# Patient Record
Sex: Female | Born: 1935
Health system: Southern US, Community
[De-identification: ages and names within clinical notes are randomized; demographics above are authoritative.]

## PROBLEM LIST (undated history)

## (undated) DIAGNOSIS — H409 Unspecified glaucoma: Secondary | ICD-10-CM

## (undated) DIAGNOSIS — K449 Diaphragmatic hernia without obstruction or gangrene: Secondary | ICD-10-CM

## (undated) DIAGNOSIS — E042 Nontoxic multinodular goiter: Secondary | ICD-10-CM

## (undated) DIAGNOSIS — M810 Age-related osteoporosis without current pathological fracture: Secondary | ICD-10-CM

## (undated) DIAGNOSIS — E7800 Pure hypercholesterolemia, unspecified: Secondary | ICD-10-CM

## (undated) DIAGNOSIS — Z87891 Personal history of nicotine dependence: Secondary | ICD-10-CM

## (undated) DIAGNOSIS — R7301 Impaired fasting glucose: Secondary | ICD-10-CM

## (undated) HISTORY — PX: CATARACT EXTRACTION, BILATERAL: SHX1313

## (undated) HISTORY — PX: APPENDECTOMY: SHX54

## (undated) HISTORY — DX: Impaired fasting glucose: R73.01

## (undated) HISTORY — DX: Unspecified glaucoma: H40.9

## (undated) HISTORY — DX: Diaphragmatic hernia without obstruction or gangrene: K44.9

## (undated) HISTORY — PX: TONSILLECTOMY: SUR1361

## (undated) HISTORY — DX: Personal history of nicotine dependence: Z87.891

## (undated) HISTORY — DX: Age-related osteoporosis without current pathological fracture: M81.0

## (undated) HISTORY — DX: Pure hypercholesterolemia, unspecified: E78.00

## (undated) HISTORY — DX: Nontoxic multinodular goiter: E04.2

---

## 2000-07-08 DIAGNOSIS — D126 Benign neoplasm of colon, unspecified: Secondary | ICD-10-CM

## 2000-07-08 HISTORY — DX: Benign neoplasm of colon, unspecified: D12.6

## 2000-08-01 ENCOUNTER — Ambulatory Visit (HOSPITAL_COMMUNITY): Admission: RE | Admit: 2000-08-01 | Discharge: 2000-08-01 | Payer: Self-pay | Admitting: *Deleted

## 2000-08-01 ENCOUNTER — Encounter: Payer: Self-pay | Admitting: Internal Medicine

## 2000-08-01 ENCOUNTER — Encounter (INDEPENDENT_AMBULATORY_CARE_PROVIDER_SITE_OTHER): Payer: Self-pay

## 2000-08-01 ENCOUNTER — Encounter (INDEPENDENT_AMBULATORY_CARE_PROVIDER_SITE_OTHER): Payer: Self-pay | Admitting: *Deleted

## 2002-05-10 DIAGNOSIS — D1803 Hemangioma of intra-abdominal structures: Secondary | ICD-10-CM

## 2002-05-10 HISTORY — DX: Hemangioma of intra-abdominal structures: D18.03

## 2003-01-21 ENCOUNTER — Encounter: Payer: Self-pay | Admitting: Internal Medicine

## 2003-01-21 ENCOUNTER — Encounter: Admission: RE | Admit: 2003-01-21 | Discharge: 2003-01-21 | Payer: Self-pay | Admitting: Internal Medicine

## 2003-02-06 ENCOUNTER — Encounter: Payer: Self-pay | Admitting: Internal Medicine

## 2003-02-06 ENCOUNTER — Encounter: Admission: RE | Admit: 2003-02-06 | Discharge: 2003-02-06 | Payer: Self-pay | Admitting: Internal Medicine

## 2003-02-12 ENCOUNTER — Encounter: Admission: RE | Admit: 2003-02-12 | Discharge: 2003-02-12 | Payer: Self-pay | Admitting: Internal Medicine

## 2003-02-12 ENCOUNTER — Encounter: Payer: Self-pay | Admitting: Internal Medicine

## 2004-07-14 ENCOUNTER — Ambulatory Visit: Payer: Self-pay | Admitting: Internal Medicine

## 2004-07-28 ENCOUNTER — Ambulatory Visit: Payer: Self-pay | Admitting: Internal Medicine

## 2005-07-15 ENCOUNTER — Encounter: Admission: RE | Admit: 2005-07-15 | Discharge: 2005-07-15 | Payer: Self-pay | Admitting: Internal Medicine

## 2008-12-29 ENCOUNTER — Emergency Department (HOSPITAL_COMMUNITY): Admission: EM | Admit: 2008-12-29 | Discharge: 2008-12-29 | Payer: Self-pay | Admitting: Emergency Medicine

## 2009-07-02 ENCOUNTER — Encounter (INDEPENDENT_AMBULATORY_CARE_PROVIDER_SITE_OTHER): Payer: Self-pay | Admitting: *Deleted

## 2009-11-20 ENCOUNTER — Encounter: Admission: RE | Admit: 2009-11-20 | Discharge: 2009-11-20 | Payer: Self-pay | Admitting: Internal Medicine

## 2009-12-03 ENCOUNTER — Encounter: Payer: Self-pay | Admitting: Internal Medicine

## 2009-12-05 ENCOUNTER — Encounter: Payer: Self-pay | Admitting: Internal Medicine

## 2009-12-16 ENCOUNTER — Encounter: Payer: Self-pay | Admitting: Internal Medicine

## 2009-12-17 ENCOUNTER — Encounter: Payer: Self-pay | Admitting: Internal Medicine

## 2010-06-09 NOTE — Op Note (Signed)
Summary: COLON                          [facility]  Patient:    Tabitha Ramos, Tabitha Ramos                        MRN: [id number] Proc. Date: 08/01/00 Adm. Date:  [id number] Attending:  Sabino Gasser                           Procedure Report  PROCEDURE:  Colonoscopy with biopsy and polypectomy.  GASTROENTEROLOGIST:  Sabino Gasser, M.D.  ANESTHESIA:  Demerol 60 mg, Versed 6 mg.  PROCEDURE IN DETAIL:  With the patient mildly sedated and in the left lateral decubitus position, the Olympus video colonoscope was inserted in the rectum and then passed under direct vision to the cecum identified by ileocecal valve and appendiceal orifice.  From this point, the colonoscope was slowly withdrawn, taking circumferential views of the entire colonic mucosa, stopping at 20 cm from the anal verge at which point two polyps were seen and photographed.  One was removed using hot biopsy forceps technique, the other using snare cautery technique, both at setting of 20/20 blended current.  They were retrieved for pathology.   The colonoscope was withdrawn to the rectum which appeared normal on direct view and showed internal hemorrhoid in retroflexed view.  The endoscope was straightened and withdrawn.  The patients vital signs and pulse oximetry remained stable.  The patient tolerated the procedure well with no apparent complications.  FINDINGS: 1. Polyps at 20 cm from the anal verge.  Await biopsy report. 2. Internal hemorrhoids.  PLAN:  The patient will call me for results and follow up with me as an outpatient. DD:  08/01/00 TD:  08/01/00 Job: [id number]

## 2010-06-09 NOTE — Procedures (Signed)
Summary: COLON   Colonoscopy  Procedure date:  07/28/2004  Findings:      Location:  [facility].    Procedures Next Due Date:    Colonoscopy: 08/2009 Patient Name: Tabitha Ramos, Tabitha Ramos MRN:  Procedure Procedures: Colonoscopy CPT: [address].    with biopsy. CPT: Q5068410.  Personnel: Endoscopist: Dora L. Juanda Chance, MD.  Referred By: Erskine Speed, MD.  Exam Location: Exam performed in Outpatient Clinic. Outpatient  Patient Consent: Procedure, Alternatives, Risks and Benefits discussed, consent obtained, from patient. Consent was obtained by the RN.  Indications  Surveillance of: Adenomatous Polyp(s). Initial polypectomy was performed in 2002. 1-2 Polyps were found at Index Exam. Largest polyp removed was 1 to 5 mm. Prior polyp located in distal colon.  History  Current Medications: Patient is not currently taking Coumadin.  Pre-Exam Physical: Performed Jul 28, 2004. Entire physical exam was normal.  Exam Exam: Extent of exam reached: Cecum, extent intended: Cecum.  The cecum was identified by appendiceal orifice and IC valve. Colon retroflexion performed. Images taken. ASA Classification: I. Tolerance: good.  Monitoring: Pulse and BP monitoring, Oximetry used. Supplemental O2 given.  Colon Prep Used Miralax for colon prep. Prep results: good.  Sedation Meds: Patient assessed and found to be appropriate for moderate (conscious) sedation. Fentanyl 150 mcg. given IV. Versed 12 given IV.  Findings - NORMAL EXAM: Cecum.  POLYP: Sigmoid Colon, Maximum size: 3 mm. diminutive, sessile polyp. Distance from Anus 10 cm. Procedure:  biopsy without cautery, removed, retrieved, Polyp sent to pathology. ICD9: Colon Polyps: 211. 3.   Assessment Abnormal examination, see findings above.  Diagnoses: 211.3: Colon Polyps.   Comments: diminutive polyp removed Events  Unplanned Interventions: No intervention was required.  Unplanned Events: There were no  complications. Plans Medication Plan: Await pathology.  Patient Education: Patient given standard instructions for: Yearly hemoccult testing recommended. Patient instructed to get routine colonoscopy every 5 years.  Comments: difficult exam, pt had a lot of discomfort, requiring large amount of medication, there was lot of spasm in the sigmoid colon Disposition: After procedure patient sent to recovery. After recovery patient sent home.   This report was created from the original endoscopy report, which was reviewed and signed by the above listed endoscopist.    Appended Document: COLON letter dictated to Dr Renae Fickle. Green concerning change of the recall to 5 years. Message left on pt's phone to schedule recall colon.  Appended Document: COLON Recall entered in [facility] for now. I will make sure the patient has scheduled colonoscopy within the next several weeks.  Appended Document: COLON I spoke with patient late last month and she stated she had a death in the family and would not be able to schedule her colonoscopy yet. I advised Dr Juanda Chance of this.

## 2010-06-09 NOTE — Letter (Signed)
Summary: Enrique Sack MD  Enrique Sack MD   Imported By: Lester [place] 01/16/2010 11:43:18  _____________________________________________________________________  External Attachment:    Type:   Image     Comment:   External Document

## 2010-06-09 NOTE — Letter (Signed)
Summary: Letter to Dr Chilton Si  December 17, 2009         Erskine Speed, M.D.   [address], [address]   [place], [place] [address]      RE:  Tabitha Ramos, Tabitha Ramos   MRN:  [id number]  /  DOB:  05/23/1930      Dear Renae Fickle,      Thank you so much for your letter concerning Tabitha Ramos's recall   colonoscopy.  I went back and reviewed her records.  She did have a   tubulovillous adenoma on colonoscopy in 2002, and subsequently on   colonoscopy in March 2006, she had a hyperplastic polyp.  I have   discussed the recall guidelines for this type of situation with my   associates and we agreed that 5-year recall would be reasonable rather   than a 7-year recall as I suggested.  I have notified Tabitha Ramos, left   her message on her phone that we discussed it and that I would suggest   for her to have a colonoscopy this year.      Thank you very much for bringing this issue to my attention.  I am   always happy to review the case at your request.         Sincerely,               Hedwig Morton. Juanda Chance, MD         DMB/MedQ  DD: 12/17/2009  DT: 12/17/2009  Job #: [id number]

## 2010-06-09 NOTE — Letter (Signed)
Summary: Colonoscopy Date Change Letter  [facility]  [address] [place], [place] [address]   Phone: [phone]  Fax: [phone]      July 02, 2009 MRN: [id number]   Tabitha Ramos [address] [place], [place]  [address]   Dear [name],   Previously you were recommended to have a repeat colonoscopy around this time. Your chart was recently reviewed by Dr. Hedwig Morton. Juanda Chance of [facility]. Follow up colonoscopy is now recommended in March 2013. This revised recommendation is based on current, nationally recognized guidelines for colorectal cancer screening and polyp surveillance. These guidelines are endorsed by the American Cancer Society, [employer] on Colorectal Cancer as well as numerous other major medical organizations.  Please understand that our recommendation assumes that you do not have any new symptoms such as bleeding, a change in bowel habits, anemia, or significant abdominal discomfort. If you do have any concerning GI symptoms or want to discuss the guideline recommendations, please call to arrange an office visit at your earliest convenience. Otherwise we will keep you in our reminder system and contact you 1-2 months prior to the date listed above to schedule your next colonoscopy.  Thank you,  Hedwig Morton. Juanda Chance, M.D  [facility] [phone]

## 2010-08-15 LAB — DIFFERENTIAL
Basophils Absolute: 0.1 10*3/uL (ref 0.0–0.1)
Basophils Relative: 1 % (ref 0–1)
Eosinophils Absolute: 0.1 10*3/uL (ref 0.0–0.7)
Eosinophils Relative: 1 % (ref 0–5)
Lymphocytes Relative: 19 % (ref 12–46)

## 2010-08-15 LAB — CBC
HCT: 42.9 % (ref 36.0–46.0)
Platelets: 180 10*3/uL (ref 150–400)
RDW: 12.9 % (ref 11.5–15.5)

## 2010-08-15 LAB — BASIC METABOLIC PANEL
BUN: 8 mg/dL (ref 6–23)
GFR calc non Af Amer: >60 mL/min (ref >60)
Glucose, Bld: 93 mg/dL (ref 70–99)
Potassium: 4.3 mEq/L (ref 3.5–5.1)

## 2010-08-15 LAB — D-DIMER, QUANTITATIVE: D-Dimer, Quant: 0.32 ug/mL-FEU (ref 0.00–0.48)

## 2010-09-25 NOTE — Letter (Signed)
December 17, 2009    Erskine Speed, M.D.  [address], [address]  [place], [place] [address]   RE:  LARUE, DRAWDY  MRN:  [id number]  /  DOB:  [birth year]   Dear Renae Fickle,   Thank you so much for your letter concerning Ms. Mollenhauer's recall  colonoscopy.  I went back and reviewed her records.  She did have a  tubulovillous adenoma on colonoscopy in 2002, and subsequently on  colonoscopy in March 2006, she had a hyperplastic polyp.  I have  discussed the recall guidelines for this type of situation with my  associates and we agreed that 5-year recall would be reasonable rather  than a 7-year recall as I suggested.  I have notified Ms. Stenseth, left  her message on her phone that we discussed it and that I would suggest  for her to have a colonoscopy this year.   Thank you very much for bringing this issue to my attention.  I am  always happy to review the case at your request.    Sincerely,      Hedwig Morton. Juanda Chance, MD    DMB/MedQ  DD: 12/17/2009  DT: 12/17/2009  Job #: [id number]

## 2010-09-25 NOTE — Procedures (Signed)
[facility]  Patient:    Tabitha Ramos, Tabitha Ramos                        MRN: [id number] Proc. Date: 08/01/00 Adm. Date:  [id number] Attending:  Sabino Gasser                           Procedure Report  PROCEDURE:  Colonoscopy with biopsy and polypectomy.  GASTROENTEROLOGIST:  Sabino Gasser, M.D.  ANESTHESIA:  Demerol 60 mg, Versed 6 mg.  PROCEDURE IN DETAIL:  With the patient mildly sedated and in the left lateral decubitus position, the Olympus video colonoscope was inserted in the rectum and then passed under direct vision to the cecum identified by ileocecal valve and appendiceal orifice.  From this point, the colonoscope was slowly withdrawn, taking circumferential views of the entire colonic mucosa, stopping at 20 cm from the anal verge at which point two polyps were seen and photographed.  One was removed using hot biopsy forceps technique, the other using snare cautery technique, both at setting of 20/20 blended current.  They were retrieved for pathology.   The colonoscope was withdrawn to the rectum which appeared normal on direct view and showed internal hemorrhoid in retroflexed view.  The endoscope was straightened and withdrawn.  The patients vital signs and pulse oximetry remained stable.  The patient tolerated the procedure well with no apparent complications.  FINDINGS: 1. Polyps at 20 cm from the anal verge.  Await biopsy report. 2. Internal hemorrhoids.  PLAN:  The patient will call me for results and follow up with me as an outpatient. DD:  08/01/00 TD:  08/01/00 Job: [id number]

## 2012-02-21 ENCOUNTER — Other Ambulatory Visit: Payer: Self-pay | Admitting: Internal Medicine

## 2012-02-21 DIAGNOSIS — Z780 Asymptomatic menopausal state: Secondary | ICD-10-CM

## 2012-02-21 DIAGNOSIS — Z1231 Encounter for screening mammogram for malignant neoplasm of breast: Secondary | ICD-10-CM

## 2012-03-03 ENCOUNTER — Other Ambulatory Visit: Payer: Self-pay | Admitting: Internal Medicine

## 2012-03-03 ENCOUNTER — Ambulatory Visit
Admission: RE | Admit: 2012-03-03 | Discharge: 2012-03-03 | Disposition: A | Discharge status: Home / Self Care | Payer: Medicare Other | Source: Ambulatory Visit | Attending: Internal Medicine | Admitting: Internal Medicine

## 2012-03-03 DIAGNOSIS — M25569 Pain in unspecified knee: Secondary | ICD-10-CM

## 2012-03-20 ENCOUNTER — Other Ambulatory Visit: Payer: Self-pay

## 2012-03-20 ENCOUNTER — Ambulatory Visit: Payer: Self-pay

## 2012-04-07 ENCOUNTER — Ambulatory Visit
Admission: RE | Admit: 2012-04-07 | Discharge: 2012-04-07 | Disposition: A | Discharge status: Home / Self Care | Payer: Medicare Other | Source: Ambulatory Visit | Attending: Internal Medicine | Admitting: Internal Medicine

## 2012-04-07 DIAGNOSIS — Z780 Asymptomatic menopausal state: Secondary | ICD-10-CM

## 2012-04-07 DIAGNOSIS — Z1231 Encounter for screening mammogram for malignant neoplasm of breast: Secondary | ICD-10-CM

## 2013-03-26 ENCOUNTER — Ambulatory Visit
Admission: RE | Admit: 2013-03-26 | Discharge: 2013-03-26 | Disposition: A | Discharge status: Home / Self Care | Payer: Medicare Other | Source: Ambulatory Visit | Attending: Internal Medicine | Admitting: Internal Medicine

## 2013-03-26 ENCOUNTER — Other Ambulatory Visit: Payer: Self-pay | Admitting: Internal Medicine

## 2013-03-26 DIAGNOSIS — R05 Cough: Secondary | ICD-10-CM

## 2013-03-26 DIAGNOSIS — R509 Fever, unspecified: Secondary | ICD-10-CM

## 2014-09-12 IMAGING — CR DG TIBIA/FIBULA 2V*R*
2 series · 2 of 2 positions shown · non-contrast
Comparison: None

CLINICAL DATA: Lower leg pain, no injury

RIGHT TIBIA AND FIBULA - 2 VIEW

[view not recorded (1 of 2)]
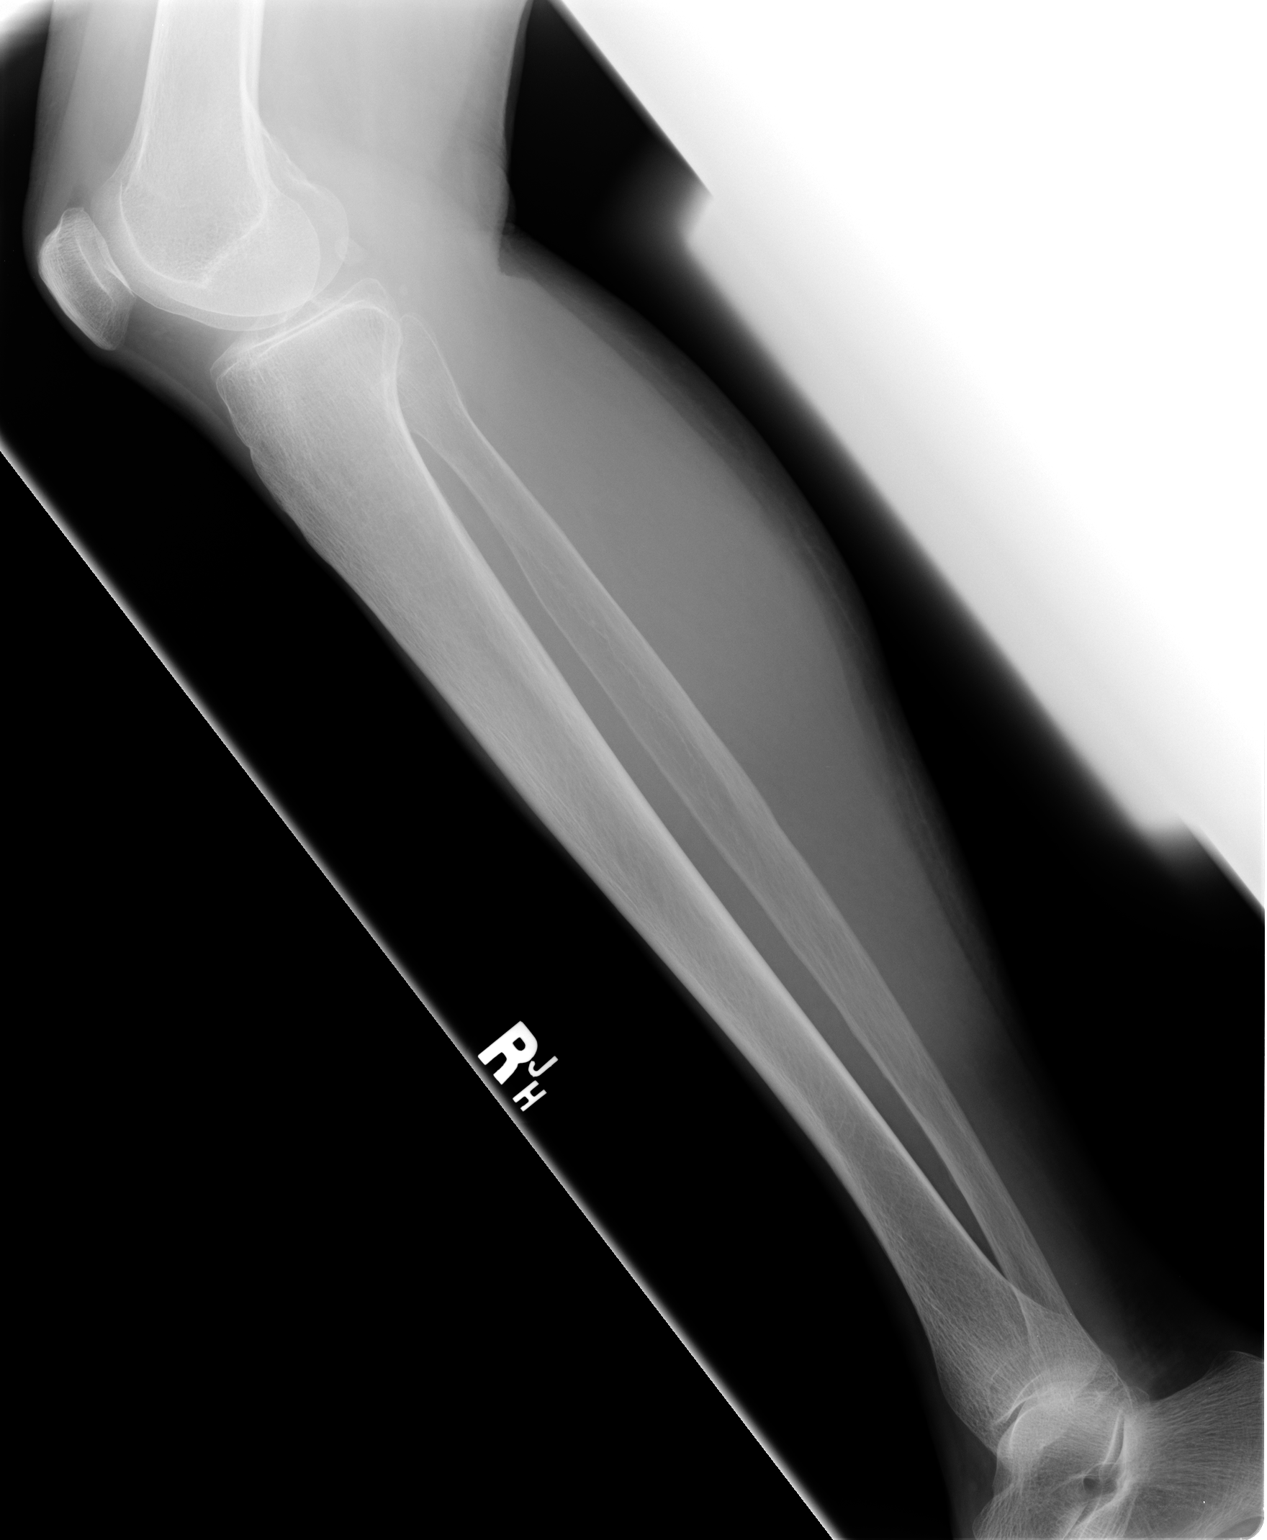

[view not recorded (2 of 2)]
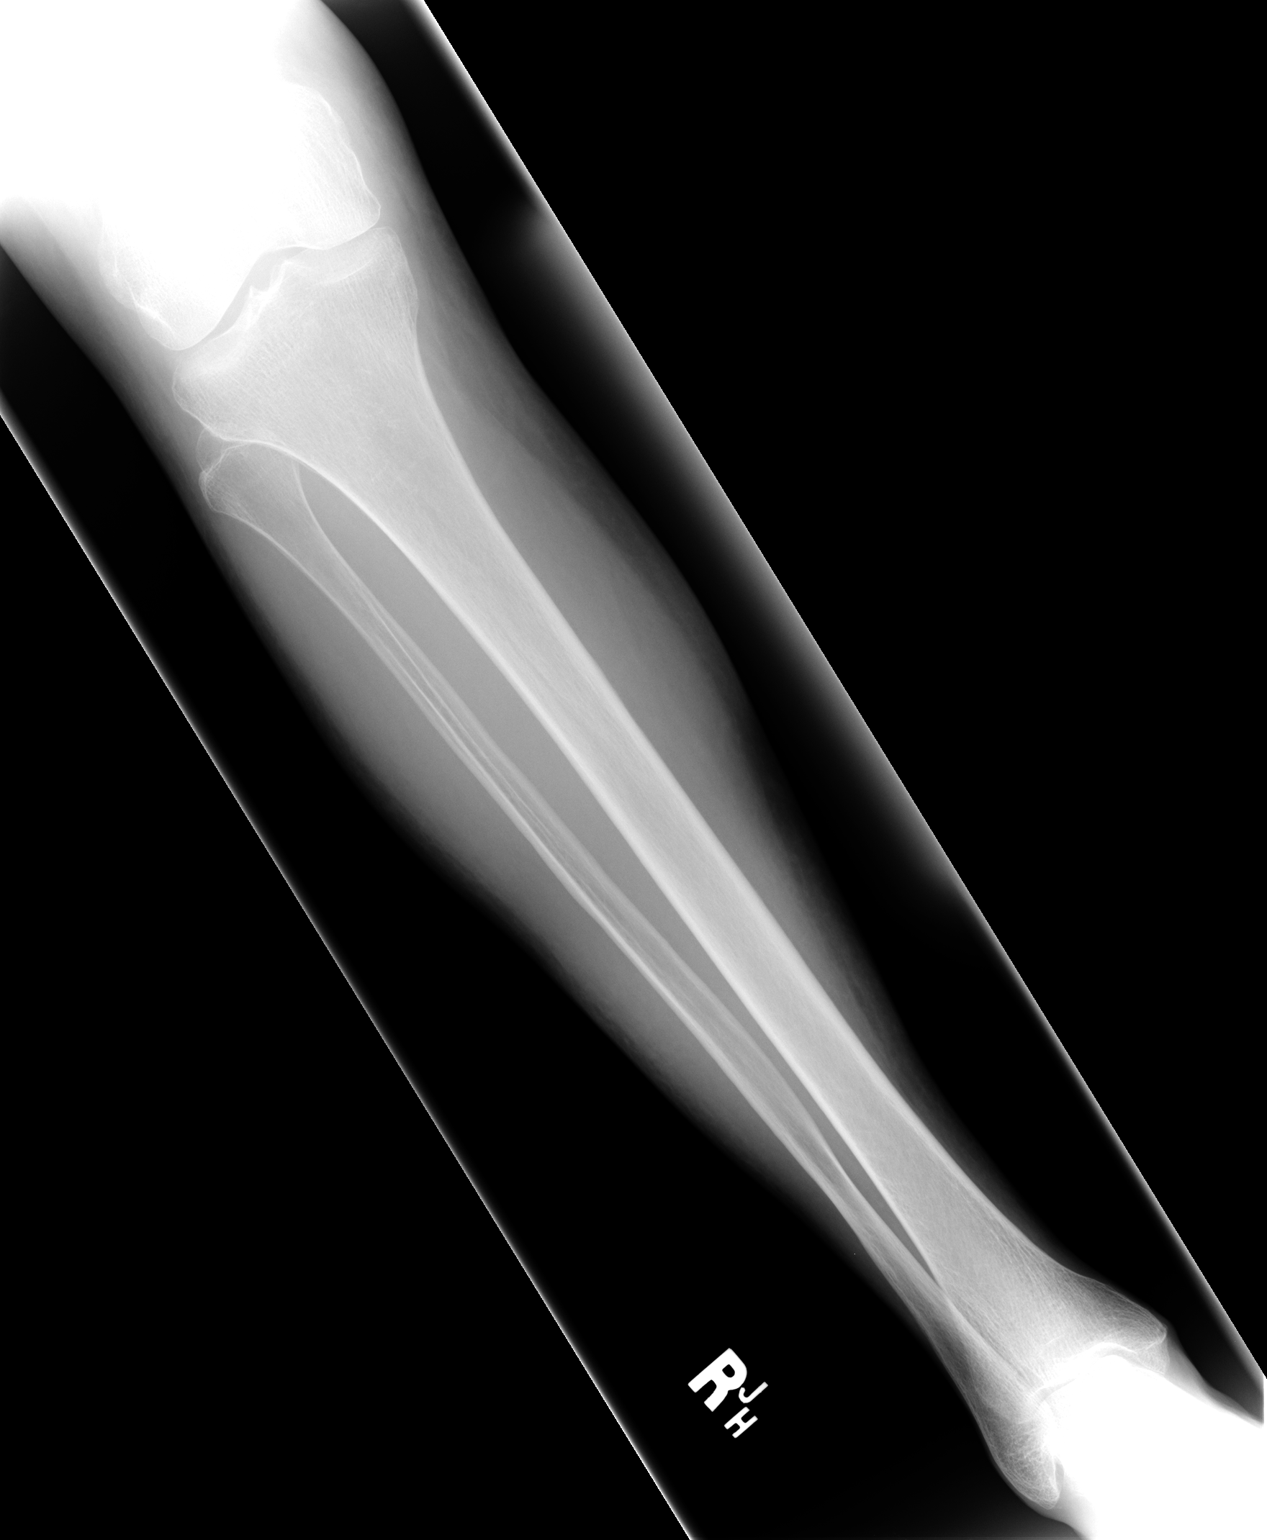

[2 of 2 positions shown; findings below may reference images not displayed]

FINDINGS: The right tibia and fibula are in normal alignment.  No
acute bony abnormality is seen.  No periosteal reaction is noted.
IMPRESSION: Negative.

## 2014-09-12 IMAGING — CR DG KNEE COMPLETE 4+V*R*
4 series · 4 of 4 positions shown · non-contrast
Comparison: None.

CLINICAL DATA: Knee pain, no known injury

RIGHT KNEE - COMPLETE 4+ VIEW

[view not recorded (1 of 4)]
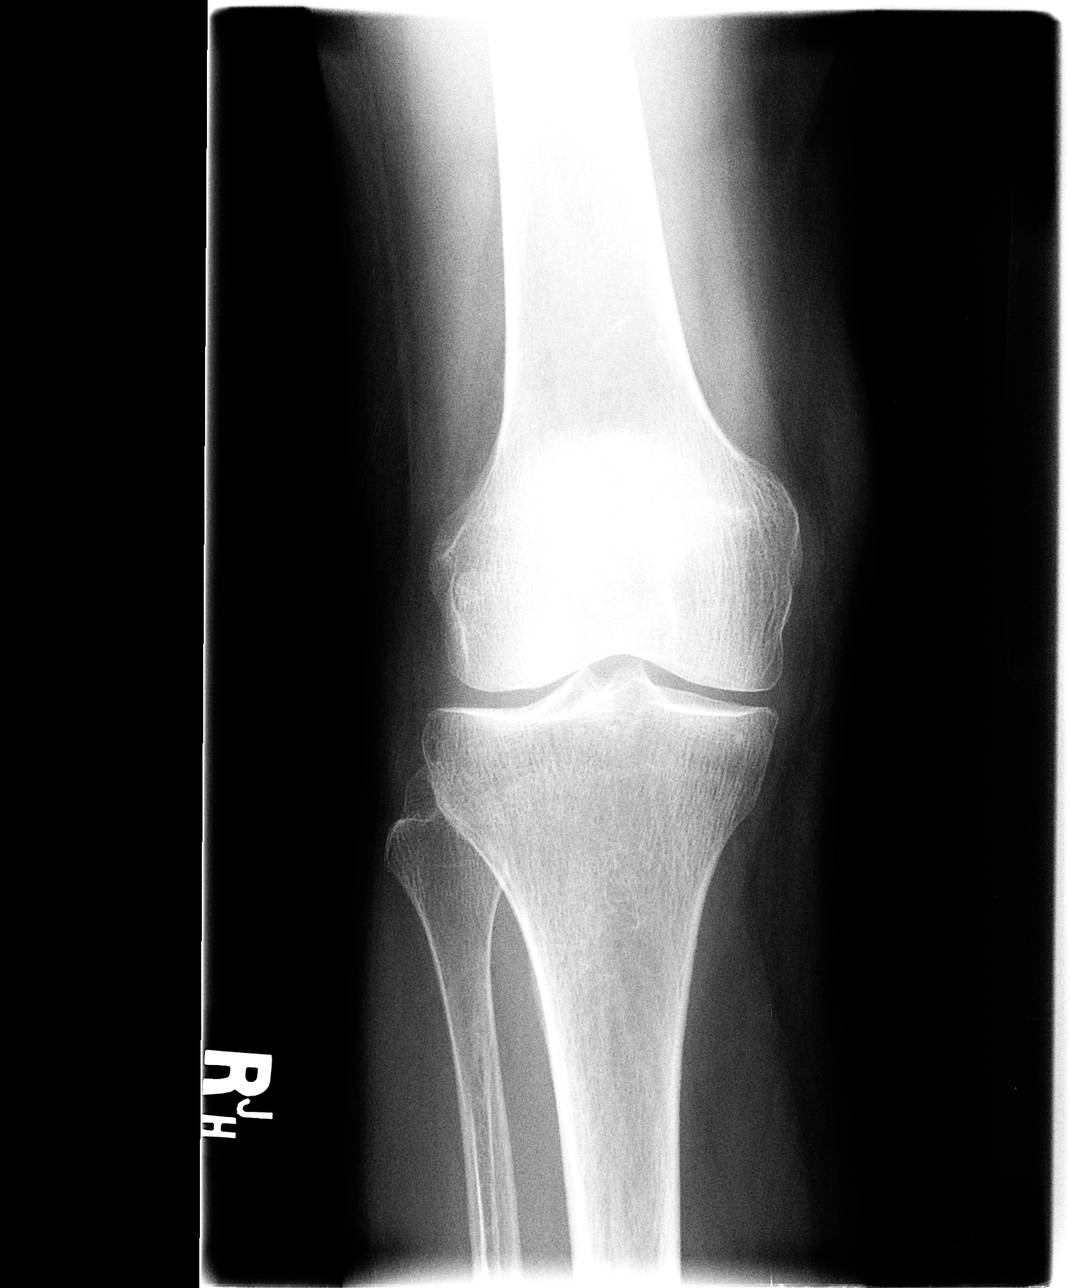

[view not recorded (2 of 4)]
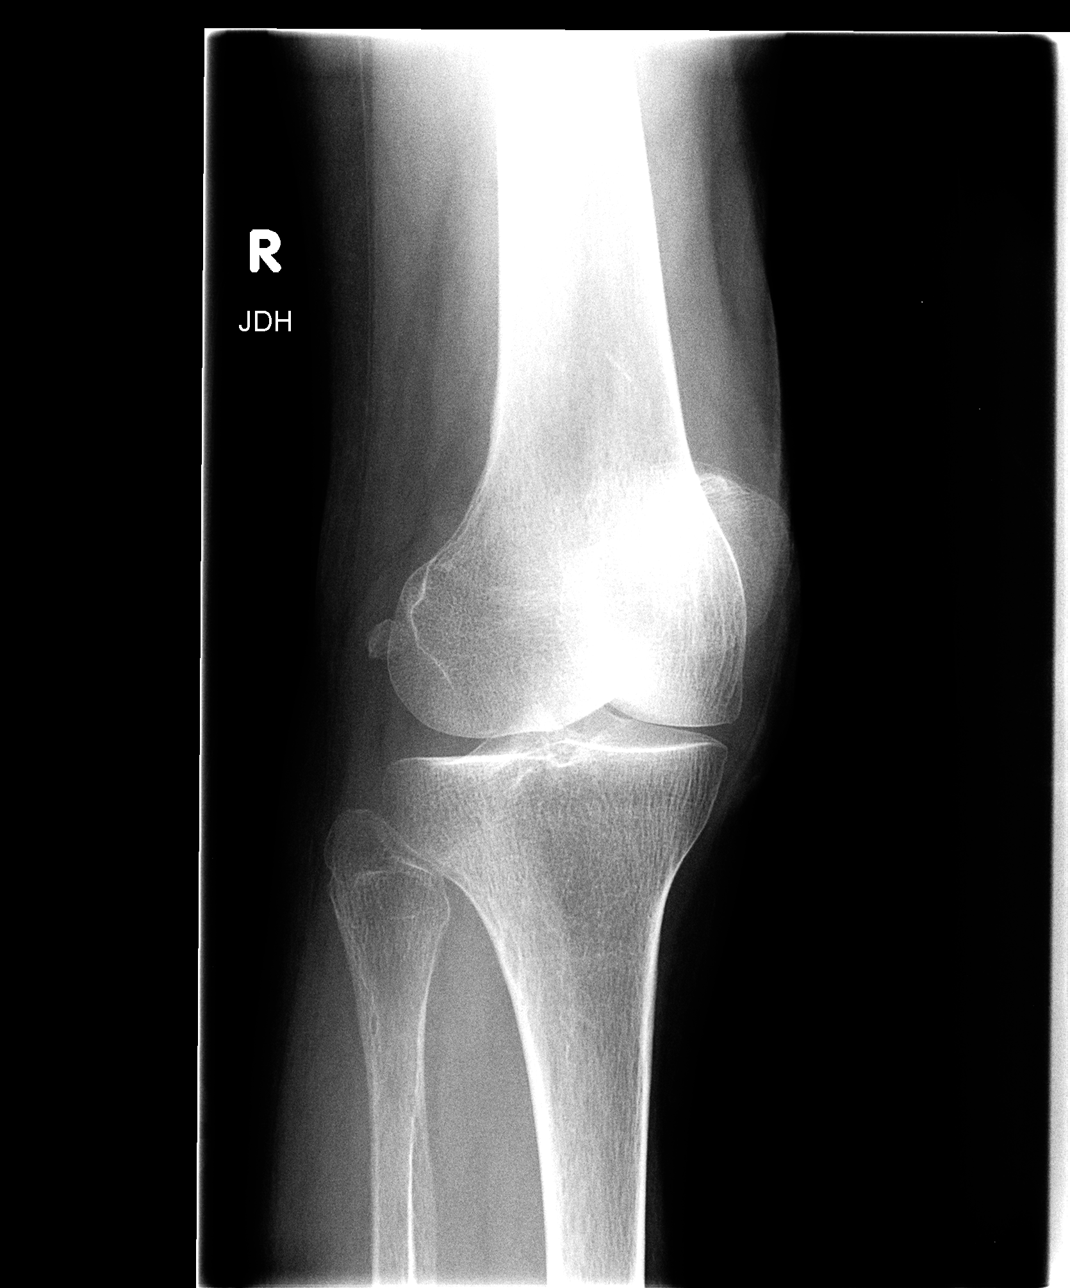

[view not recorded (3 of 4)]
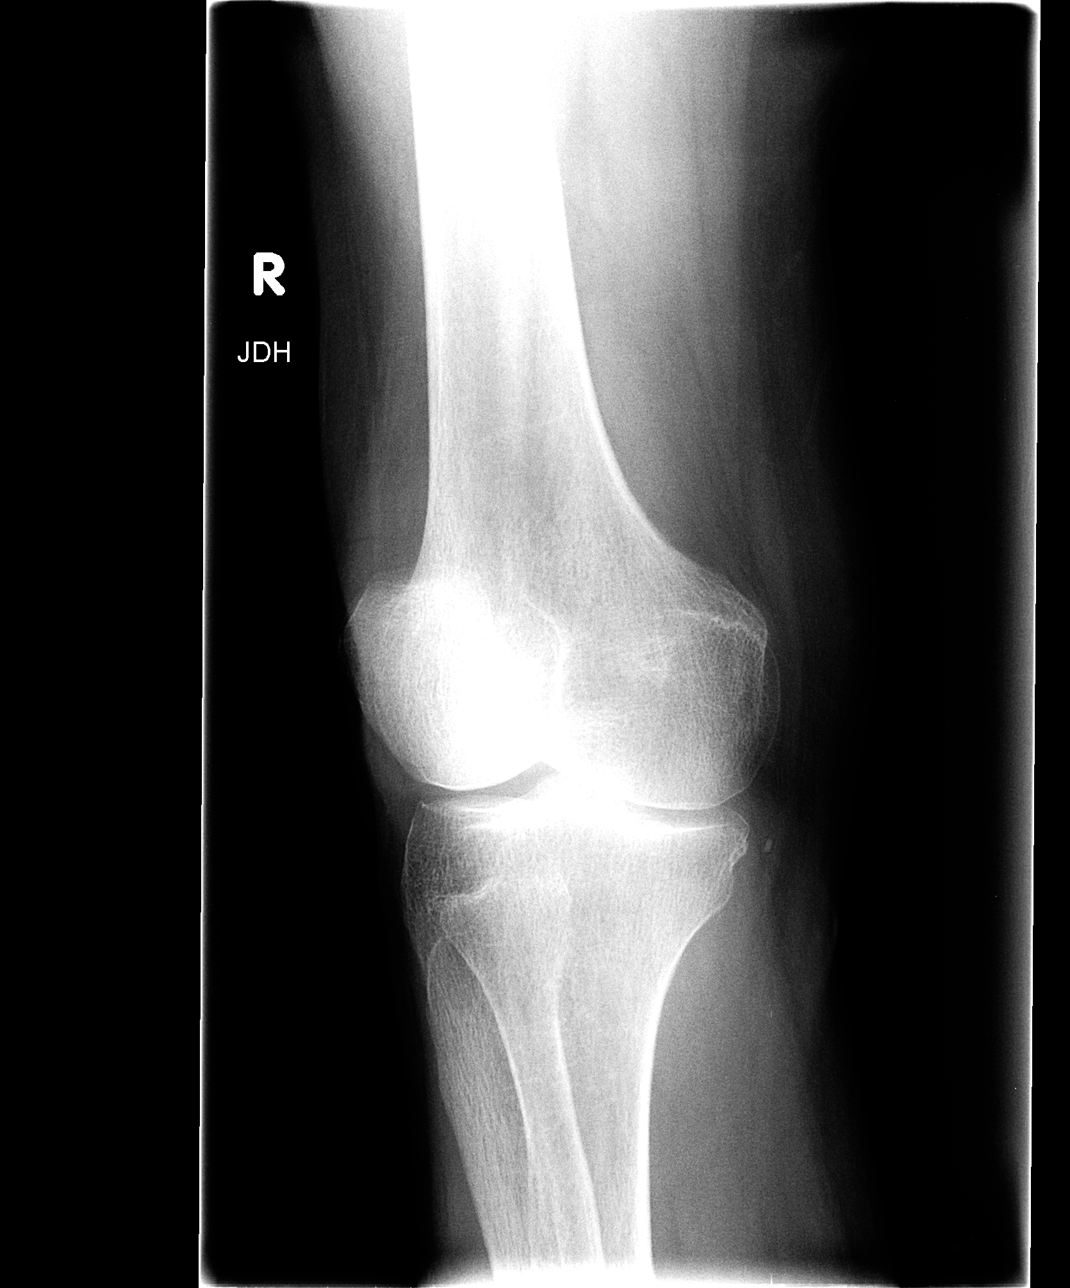

[view not recorded (4 of 4)]
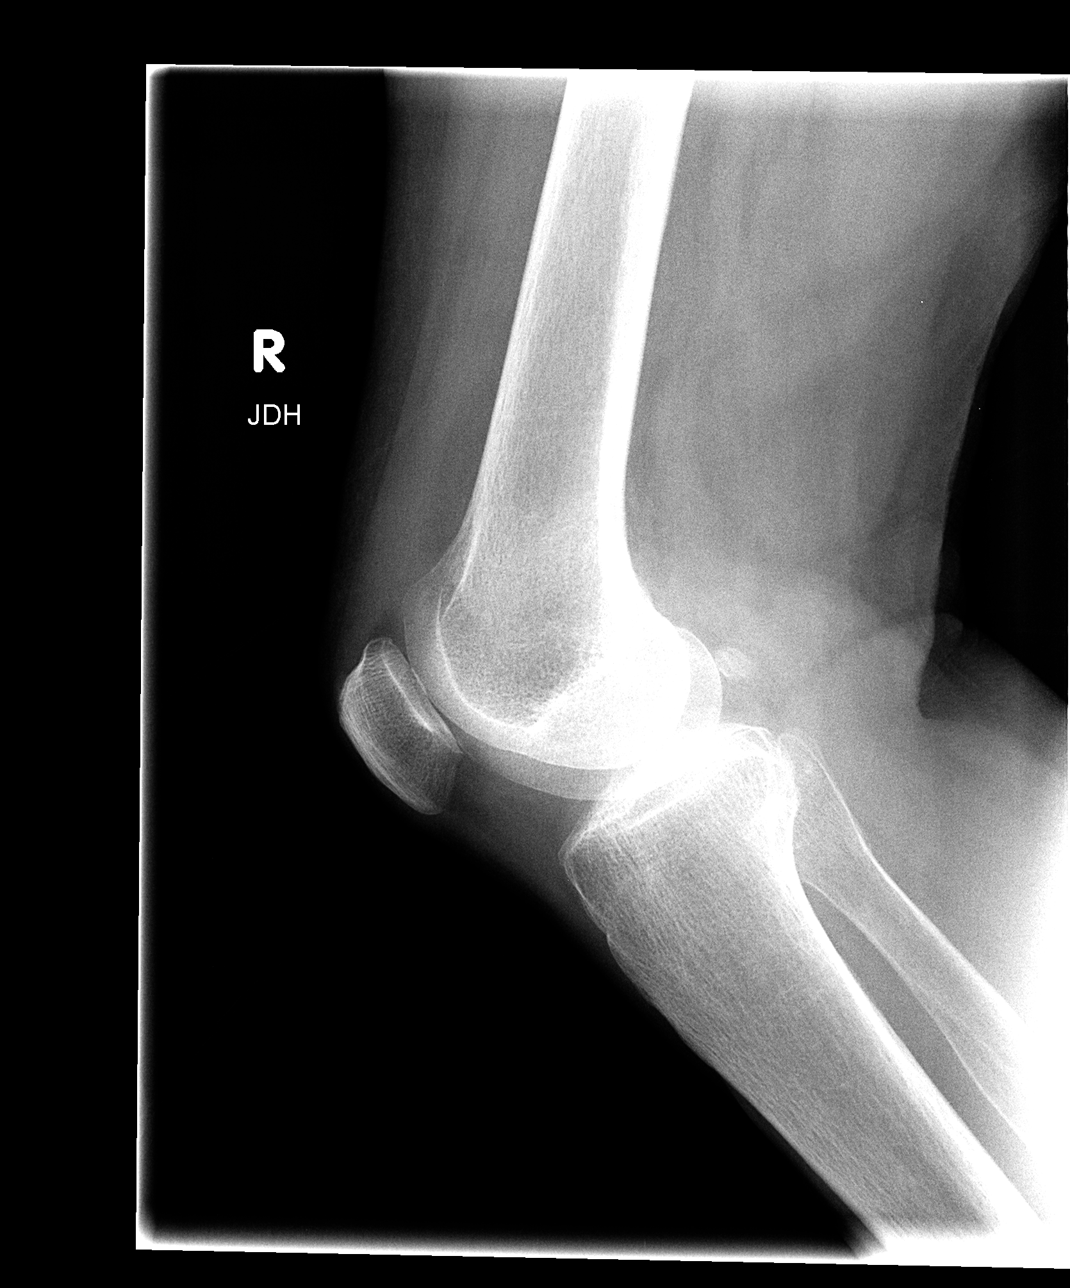

[4 of 4 positions shown; findings below may reference images not displayed]

FINDINGS: The knee joint spaces are relatively normal for age. Very
little degenerative change is present.  No fracture is seen.  No
effusion is noted.
IMPRESSION: Negative.

## 2018-11-21 ENCOUNTER — Other Ambulatory Visit: Payer: Self-pay | Admitting: Internal Medicine

## 2018-11-21 DIAGNOSIS — C540 Malignant neoplasm of isthmus uteri: Secondary | ICD-10-CM

## 2018-11-22 ENCOUNTER — Ambulatory Visit
Admission: RE | Admit: 2018-11-22 | Discharge: 2018-11-22 | Disposition: A | Discharge status: Home / Self Care | Payer: Medicare HMO | Source: Ambulatory Visit | Attending: Internal Medicine | Admitting: Internal Medicine

## 2018-11-22 DIAGNOSIS — C540 Malignant neoplasm of isthmus uteri: Secondary | ICD-10-CM

## 2018-11-23 ENCOUNTER — Other Ambulatory Visit: Payer: Self-pay | Admitting: Internal Medicine

## 2020-04-24 ENCOUNTER — Ambulatory Visit: Payer: Self-pay | Admitting: Family Medicine

## 2020-04-30 ENCOUNTER — Telehealth: Payer: Self-pay | Admitting: Family Medicine

## 2020-04-30 NOTE — Telephone Encounter (Signed)
Perfect, thanks 

## 2020-04-30 NOTE — Telephone Encounter (Signed)
Pt had a family member drop off chart. Sending back.

## 2020-04-30 NOTE — Telephone Encounter (Signed)
Called pt to make her a get established appt. Pt states that her son recently died and she had a fall. Pt very sweet. Pt made a get established appt for beginning of February. Pt states she will bring by her records from Dr. Nyoka Cowden next week.

## 2020-05-01 ENCOUNTER — Encounter: Payer: Self-pay | Admitting: Family Medicine

## 2020-06-16 ENCOUNTER — Institutional Professional Consult (permissible substitution): Payer: Self-pay | Admitting: Family Medicine

## 2020-07-09 NOTE — Progress Notes (Signed)
Chief Complaint  Patient presents with  . Medicare Wellness    NP fasting AWV/CPE. Having some issues with bruising easily. Had a fall several months ago on her cement walkway, and her tailbone still occasionally hurts. Did not get a flu shot this year.     Tabitha Ramos is a 87 y.o. female who presents to establish care, and for annual physical exam and Medicare wellness visit. She is accompanied by her niece, Tabitha Ramos.  She has the following concerns:  She is bruising easily. Has a bruise at her left hand/wrist, thinks she bumped in on the faucet at the sink.  She has some pain in her tailbone since she fell the end of November.  She missed a step, going up a stair, lost her balance and fell on the concrete.  She wasn't able to get up by herself.  A neighbor helped her up, and she was able to walk.  She had been in a lot of pain, but it has improved. She reports a lot of bruising, which has resolved.  Osteoporosis:  She recalls being treated with Boniva--can't recall how long she took it, but needed to stop it prior to pulling her teeth.  It was never restarted. She takes a calcium supplement and vitamin D. She gets no regular weight-bearing exercise.  Hyperlipidemia: noted to have significantly elevated cholesterol when reviewing records from Dr. Nyoka Cowden (see results below).  She has never been treated for her cholesterol.  Diet includes: 2% milk, +cheese and ice cream No ground beef in the last 3 weeks (doesn't eat too often); eats chicken and fish.  +mayo and egg with her tuna.   Immunization History  Administered Date(s) Administered  . Influenza, High Dose Seasonal PF 04/19/2013, 04/19/2014, 02/12/2015, 03/25/2016, 03/30/2017, 04/03/2018, 04/09/2019  . Pneumococcal Conjugate-13 04/09/2014  . Pneumococcal Polysaccharide-23 03/10/2005  . Tdap 03/11/2011  . Zoster Recombinat (Shingrix) 03/10/2017   She had planned to get J&J vaccine, cancelled "when the bad press came out".  Never got  any COVID vaccines.  Didn't get flu shot this year. Doesn't want, no going anywhere. Last Pap smear: s/p hysterectomy Last mammogram: 03/2012 Last colonoscopy: 2006, hyperplastic polyp (rec 2011 repeat); h/o adenomatous polyp 2002) Last DEXA: 05/2011 T-2.9 spine, -3.5 L fem neck Dentist: 2018; had all teeth pulled, got dentures made, but they never fit right Ophtho: goes regularly (glaucoma) Exercise: Prior to her fall in 03/2020, she was walking around her community about 30 minutes, most days, weather-permitting.    Labs from Dr. Rolly Salter records: 03/2019 labs: glucose 114, rest of c-met normal. TC 292, TG 164, LDL 195 Normal TSH in 03/2018   Other doctors caring for patient include: GI: Dr. Olevia Perches (retired) Ophtho: Dr. Jola Schmidt Dentist: Dr. Elbert Ewings  Depression screen:  negative Fall Screen: one fall, injured tailbone (not evaluated/treated) Functional Status Survey: notable for decreased hearing (mild), short-term memory issues (remembering names takes a little longer), has glaucoma. Not driving much. Rare incontinence Mini-Cog: normal See Epic for full questionnaires/screens  End of Life Discussion:  Patient does not have a living will and medical power of attorney  Past Medical History:  Diagnosis Date  . Adenomatous colon polyp 07/2000   tubulovillous adenoma on colonoscopy (Dr. Lajoyce Corners); Colonoscopy 2006 Dr. Olevia Perches hyperplastic polyp only  . Former smoker    quit 2017  . Glaucoma   . Hepatic hemangioma 2004   noted on CT 2001, 2004  . Hiatal hernia    small, noted on CT (2004)  .  Hypercholesteremia   . Impaired fasting glucose   . Multiple thyroid nodules    [place] 11/2018, benign  . Osteoporosis    Boniva 2007-2010, 2013-2017 (stopped for oral surgery, never restarted); 03/2012 T-3.5 L fem neck, -2.9 spine   Past Surgical History:  Procedure Laterality Date  . APPENDECTOMY    . CATARACT EXTRACTION, BILATERAL Bilateral    in her 3's  . TONSILLECTOMY    .  TOTAL ABDOMINAL HYSTERECTOMY W/ BILATERAL SALPINGOOPHORECTOMY  1942   fibroids   Social History   Social History Narrative   Widowed (husband was a diabetic)   Living in a condo; brother lives with her.   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   Social History   Tobacco Use  . Smoking status: Former Smoker    Quit date: 07/10/2013    Years since quitting: 7.0  . Smokeless tobacco: Never Used   Family History  Problem Relation Age of Onset  . Diabetes Son   . Cirrhosis Son   . Bladder Cancer Brother   . Alcoholism Brother     Outpatient Encounter Medications as of 07/10/2020  Medication Sig Note  . Ascorbic Acid (VITAMIN C) 1000 MG tablet Take 1,000 mg by mouth daily.   Marland Kitchen b complex vitamins capsule Take 1 capsule by mouth daily.   . Cholecalciferol 25 MCG (1000 UT) capsule Take by mouth.   . dorzolamide-timolol (COSOPT) 22.3-6.8 MG/ML ophthalmic solution 1 drop 2 (two) times daily.   . Menaquinone-7 (VITAMIN K2) 100 MCG CAPS Take 1 capsule by mouth daily.   . Multiple Vitamin (MULTIVITAMIN) tablet Take 1 tablet by mouth daily.   . Multiple Vitamins-Minerals (ZINC PO) Take 1 tablet by mouth daily. 07/10/2020: With calcium and mg  . pilocarpine (PILOCAR) 2 % ophthalmic solution INSTILL 1 DROP IN BOTH EYES 4 TIMES A DAY   . ROCKLATAN 0.02-0.005 % SOLN Apply 1 drop to eye at bedtime.    No facility-administered encounter medications on file as of 07/10/2020.   No Known Allergies   ROS:  Patient denies anorexia, fever, weight changes, headaches, vision changes (has glaucoma, no recent changes), URI symptoms, breast concerns, chest pain, palpitations, dizziness, syncope, dyspnea on exertion, cough, swelling, nausea, vomiting, diarrhea, constipation, abdominal pain, melena, hematochezia, indigestion/heartburn, hematuria, incontinence, dysuria, vaginal bleeding,  discharge, odor or itch, genital lesions, joint pains, numbness, tingling, weakness, tremor, suspicious skin lesions, depression, anxiety, abnormal bleeding, or enlarged lymph nodes.  Some dizziness, rare--she thinks from eye drops.  Gets cold after one of the eye drops, and a short-lived discomfort in forehead after drops. occasional constipation. Easy bruising occ trouble sleeping Mild residual tailbone pain since her fall.   PHYSICAL EXAM:  BP 110/64   Pulse 68   Ht 5' 1" (1.549 m)   Wt 101 lb 9.6 oz (46.1 kg)   BMI 19.20 kg/m   BP 110/64   Pulse 68   Ht 5' 1" (1.549 m)   Wt 101 lb 9.6 oz (46.1 kg)   BMI 19.20 kg/m   General Appearance:    Alert, cooperative, no distress, thin elderly female in good spirits  Head:    Normocephalic, without obvious abnormality, atraumatic  Eyes:    PERRL, conjunctiva/corneas clear, EOM's intact, fundi    Benign, though not well visualized  Ears:    Normal TM's  and external ear canals  Nose:   Not examined, wearing mask due to COVID-19 pandemic  Throat:   Not examined, wearing mask due to COVID-19 pandemic  Neck:   Supple, no lymphadenopathy;  thyroid:  no enlargement/ tenderness/nodules; no carotid bruit or JVD  Back:    Spine nontender, no curvature, ROM normal, no CVA  tenderness. Nontender at sacrum  Lungs:     Clear to auscultation bilaterally without wheezes, rales or     ronchi; respirations unlabored  Chest Wall:    No tenderness or deformity   Heart:    Regular rate and rhythm, S1 and S2 normal, no murmur, rub   or gallop  Breast Exam:    Exam declined by patient  Abdomen:     Soft, non-tender, nondistended, normoactive bowel sounds,    no masses, no hepatosplenomegaly  Genitalia:    Exam declined by patient  Rectal:   Exam declined by patient  Extremities:   No clubbing, cyanosis or edema  Pulses:   2+ and symmetric all extremities  Skin:   Skin color, texture, turgor normal, no rashes or suspicious lesions. She has bruising at  L hand (dorsum, at 1st webspace) and purpura at L wrist.  There are scattered cherry angiomas, SK's and skin tags.  Lymph nodes:   Cervical, supraclavicular, inguinal and axillary nodes normal  Neurologic:   She is alert and oriented. Normal strength, sensation and gait; reflexes 2+ and symmetric throughout          Psych:   Normal mood, affect, hygiene and grooming.    ASSESSMENT/PLAN:  Annual physical exam  Medicare annual wellness visit, subsequent  Osteoporosis without current pathological fracture, unspecified osteoporosis type - Discussed risks, Ca, D and weight-bearing exercise.  Encouraged to get DEXA, and to consider treatment if abnl - Plan: DG Bone Density, VITAMIN D 25 Hydroxy (Vit-D Deficiency, Fractures)  Mixed hyperlipidemia - reviewed low cholesterol diet.  Given her age, would not start statin - Plan: Lipid panel  Elevated fasting glucose - reviewed proper diet, daily exercise encouraged - Plan: Comprehensive metabolic panel  Need for pneumococcal vaccination - Plan: Pneumococcal polysaccharide vaccine 23-valent greater than or equal to 2yo subcutaneous/IM  Thyroid nodule - noted in prior records, exam is not concerning today - Plan: TSH  Bruising - pt reassured.  She really had minimal bruising for her age. Will check CBC - Plan: CBC with Differential/Platelet  Senile purpura (HCC) - explained changes to skin with aging; will check CBC to ensure plt normal  Vaccine counseling - counseled re: COVID vaccine, pneumovax booster, flu shot recs (declined). Will try and get someone to give her COVID in 2 weeks at her house  Injury of coccyx, initial encounter - injury was in November, never had evaluated. Some residual pain, though much improved. Declined x-ray. Disc risks for fx given osteoporosis   COVID VACCINE--patient willing to get; some transportation issues, prefers to get at home if that is still an option (otherwise can come here for NV).  Needs to wait 2 weeks due  to today's immunization  Discussed monthly self breast exams and yearly mammograms (optional given her age; can get if she wants, when she goes for DEXA); at least 30 minutes of aerobic activity at least 5 days/week and weight-bearing exercise 2x/week; proper sunscreen use reviewed; healthy diet, including goals of calcium and vitamin D intake and alcohol recommendations (less than or equal to 1 drink/day) reviewed; regular seatbelt use; changing batteries in smoke detectors, use  of carbon monoxide detectors.  Immunization recommendations discussed--Tdap due in 03/2021, to get from pharmacy.  Pneumovax booster given today.  COVID vaccines recommended, needs to wait 2 weeks.  Prefers someone to give at her home, if still an option.  Colonoscopy recommendations reviewed--no longer needed based on age, for routine screening.  To let us know if any symptoms develop.  MOST form completed, Full Code, Full Care Given paperwork for Living Will and Healthcare power of attorney, and requested her to give [place] copies once completed/notarized.   Medicare Attestation I have personally reviewed: The patient's medical and social history Their use of alcohol, tobacco or illicit drugs Their current medications and supplements The patient's functional ability including ADLs,fall risks, home safety risks, cognitive, and hearing and visual impairment Diet and physical activities Evidence for depression or mood disorders  The patient's weight, height, BMI have been recorded in the chart.  I have made referrals, counseling, and provided education to the patient based on review of the above and I have provided the patient with a written personalized care plan for preventive services.     Vikki Ports, MD     I spent 90 minutes dedicated to the care of this patient, including pre-visit review of records, face to face time, post-visit ordering of testing and documentation.

## 2020-07-10 ENCOUNTER — Ambulatory Visit (INDEPENDENT_AMBULATORY_CARE_PROVIDER_SITE_OTHER): Payer: Medicare HMO | Admitting: Family Medicine

## 2020-07-10 ENCOUNTER — Encounter: Payer: Self-pay | Admitting: Family Medicine

## 2020-07-10 ENCOUNTER — Other Ambulatory Visit: Payer: Self-pay

## 2020-07-10 VITALS — BP 110/64 | HR 68 | Ht 61.0 in | Wt 101.6 lb

## 2020-07-10 DIAGNOSIS — R7301 Impaired fasting glucose: Secondary | ICD-10-CM | POA: Diagnosis not present

## 2020-07-10 DIAGNOSIS — Z0000 Encounter for general adult medical examination without abnormal findings: Secondary | ICD-10-CM

## 2020-07-10 DIAGNOSIS — E782 Mixed hyperlipidemia: Secondary | ICD-10-CM

## 2020-07-10 DIAGNOSIS — Z7185 Encounter for immunization safety counseling: Secondary | ICD-10-CM | POA: Diagnosis not present

## 2020-07-10 DIAGNOSIS — T148XXA Other injury of unspecified body region, initial encounter: Secondary | ICD-10-CM

## 2020-07-10 DIAGNOSIS — E041 Nontoxic single thyroid nodule: Secondary | ICD-10-CM | POA: Diagnosis not present

## 2020-07-10 DIAGNOSIS — S3992XA Unspecified injury of lower back, initial encounter: Secondary | ICD-10-CM

## 2020-07-10 DIAGNOSIS — Z23 Encounter for immunization: Secondary | ICD-10-CM

## 2020-07-10 DIAGNOSIS — M810 Age-related osteoporosis without current pathological fracture: Secondary | ICD-10-CM | POA: Diagnosis not present

## 2020-07-10 DIAGNOSIS — D692 Other nonthrombocytopenic purpura: Secondary | ICD-10-CM

## 2020-07-10 NOTE — Patient Instructions (Addendum)
HEALTH MAINTENANCE RECOMMENDATIONS:  It is recommended that you get at least 30 minutes of aerobic exercise at least 5 days/week (for weight loss, you may need as much as 60-90 minutes). This can be any activity that gets your heart rate up. This can be divided in 10-15 minute intervals if needed, but try and build up your endurance at least once a week.  Weight bearing exercise is also recommended twice weekly.  Eat a healthy diet with lots of vegetables, fruits and fiber.  "Colorful" foods have a lot of vitamins (ie green vegetables, tomatoes, red peppers, etc).  Limit sweet tea, regular sodas and alcoholic beverages, all of which has a lot of calories and sugar.  Up to 1 alcoholic drink daily may be beneficial for women (unless trying to lose weight, watch sugars).  Drink a lot of water.  Calcium recommendations are 1200-1500 mg daily (1500 mg for postmenopausal women or women without ovaries), and vitamin D 1000 IU daily.  This should be obtained from diet and/or supplements (vitamins), and calcium should not be taken all at once, but in divided doses.  Monthly self breast exams and yearly mammograms for women over the age of 61 is recommended.  Sunscreen of at least SPF 30 should be used on all sun-exposed parts of the skin when outside between the hours of 10 am and 4 pm (not just when at beach or pool, but even with exercise, golf, tennis, and yard work!)  Use a sunscreen that says "broad spectrum" so it covers both UVA and UVB rays, and make sure to reapply every 1-2 hours.  Remember to change the batteries in your smoke detectors when changing your clock times in the spring and fall. Carbon monoxide detectors are recommended for your home.  Use your seat belt every time you are in a car, and please drive safely and not be distracted with cell phones and texting while driving.   Ms. Bissette , Thank you for taking time to come for your Medicare Wellness Visit. I appreciate your ongoing  commitment to your health goals. Please review the following plan we discussed and let me know if I can assist you in the future.   This is a list of the screening recommended for you and due dates:  Health Maintenance  Topic Date Due  . COVID-19 Vaccine (1) Never done  . Flu Shot  12/09/2019  . Tetanus Vaccine  03/10/2021  . DEXA scan (bone density measurement)  Completed  . Pneumonia vaccines  Completed  . HPV Vaccine  Aged Out    [facility] to schedule a follow-up bone density test, to re-evaluate your osteoporosis, so that we can get you started back on treatment, if appropriate. You may elect to schedule your mammogram at the same time.  Your tetanus shot (TdaP) is due in November.  You need to get this vaccine from the pharmacy.  Flu shots are recommended yearly, in the Fall (Sept/Oct/Nov).  We gave you a pneumonia booster today. We will look into having someone come to your house in 2 weeks to give you the first of the COVID vaccines (you will need 3 total).   Osteoporosis  Osteoporosis happens when the bones become thin and less dense than normal. Osteoporosis makes bones more brittle and fragile and more likely to break (fracture). Over time, osteoporosis can cause your bones to become so weak that they fracture after a minor fall. Bones in the hip, wrist, and spine are  most likely to fracture due to osteoporosis. What are the causes? The exact cause of this condition is not known. What increases the risk? You are more likely to develop this condition if you:  Have family members with this condition.  Have poor nutrition.  Use the following: ? Steroid medicines, such as prednisone. ? Anti-seizure medicines. ? Nicotine or tobacco, such as cigarettes, e-cigarettes, and chewing tobacco.  Are female.  Are age 14 or older.  Are not physically active (are sedentary).  Are of European or Asian descent.  Have a small body frame. What are the  signs or symptoms? A fracture might be the first sign of osteoporosis, especially if the fracture results from a fall or injury that usually would not cause a bone to break. Other signs and symptoms include:  Pain in the neck or low back.  Stooped posture.  Loss of height. How is this diagnosed? This condition may be diagnosed based on:  Your medical history.  A physical exam.  A bone mineral density test, also called a DXA or DEXA test (dual-energy X-ray absorptiometry test). This test uses X-rays to measure the amount of minerals in your bones. How is this treated? This condition may be treated by:  Making lifestyle changes, such as: ? Including foods with more calcium and vitamin D in your diet. ? Doing weight-bearing and muscle-strengthening exercises. ? Stopping tobacco use. ? Limiting alcohol intake.  Taking medicine to slow the process of bone loss or to increase bone density.  Taking daily supplements of calcium and vitamin D.  Taking hormone replacement medicines, such as estrogen for women and testosterone for men.  Monitoring your levels of calcium and vitamin D. The goal of treatment is to strengthen your bones and lower your risk for a fracture. Follow these instructions at home: Eating and drinking Include calcium and vitamin D in your diet. Calcium is important for bone health, and vitamin D helps your body absorb calcium. Good sources of calcium and vitamin D include:  Certain fatty fish, such as salmon and tuna.  Products that have calcium and vitamin D added to them (are fortified), such as fortified cereals.  Egg yolks.  Cheese.  Liver.   Activity Do exercises as told by your health care provider. Ask your health care provider what exercises and activities are safe for you. You should do:  Exercises that make you work against gravity (weight-bearing exercises), such as tai chi, yoga, or walking.  Exercises to strengthen muscles, such as lifting  weights. Lifestyle  Do not drink alcohol if: ? Your health care provider tells you not to drink. ? You are pregnant, may be pregnant, or are planning to become pregnant.  If you drink alcohol: ? Limit how much you use to:  0-1 drink a day for women.  0-2 drinks a day for men.  Know how much alcohol is in your drink. In the [place] one drink equals one 12 oz bottle of beer (355 mL), one 5 oz glass of wine (148 mL), or one 1 oz glass of hard liquor (44 mL).  Do not use any products that contain nicotine or tobacco, such as cigarettes, e-cigarettes, and chewing tobacco. If you need help quitting, ask your health care provider. Preventing falls  Use devices to help you move around (mobility aids) as needed, such as canes, walkers, scooters, or crutches.  Keep rooms well-lit and clutter-free.  Remove tripping hazards from walkways, including cords and throw rugs.  Install grab bars  in bathrooms and safety rails on stairs.  Use rubber mats in the bathroom and other areas that are often wet or slippery.  Wear closed-toe shoes that fit well and support your feet. Wear shoes that have rubber soles or low heels.  Review your medicines with your health care provider. Some medicines can cause dizziness or changes in blood pressure, which can increase your risk of falling. General instructions  Take over-the-counter and prescription medicines only as told by your health care provider.  Keep all follow-up visits. This is important. Contact a health care provider if:  You have never been screened for osteoporosis and you are: ? A woman who is age 52 or older. ? A man who is age 30 or older. Get help right away if:  You fall or injure yourself. Summary  Osteoporosis is thinning and loss of density in your bones. This makes bones more brittle and fragile and more likely to break (fracture),even with minor falls.  The goal of treatment is to strengthen your bones and lower your risk for  a fracture.  Include calcium and vitamin D in your diet. Calcium is important for bone health, and vitamin D helps your body absorb calcium.  Talk with your health care provider about screening for osteoporosis if you are a woman who is age 33 or older, or a man who is age 29 or older. This information is not intended to replace advice given to you by your health care provider. Make sure you discuss any questions you have with your health care provider. Document Revised: 10/11/2019 Document Reviewed: 10/11/2019 Elsevier Patient Education  2021 [place].   Cholesterol Content in Foods Cholesterol is a waxy, fat-like substance that helps to carry fat in the blood. The body needs cholesterol in small amounts, but too much cholesterol can cause damage to the arteries and heart. Most people should eat less than 200 milligrams (mg) of cholesterol a day. Foods with cholesterol Cholesterol is found in animal-based foods, such as meat, seafood, and dairy. Generally, low-fat dairy and lean meats have less cholesterol than full-fat dairy and fatty meats. The milligrams of cholesterol per serving (mg per serving) of common cholesterol-containing foods are listed below. Meat and other proteins  Egg -- one large whole egg has 186 mg.  Veal shank -- 4 oz has 141 mg.  Lean ground [place] (93% lean) -- 4 oz has 118 mg.  Fat-trimmed lamb loin -- 4 oz has 106 mg.  Lean ground beef (90% lean) -- 4 oz has 100 mg.  Lobster -- 3.5 oz has 90 mg.  Pork loin chops -- 4 oz has 86 mg.  Canned salmon -- 3.5 oz has 83 mg.  Fat-trimmed beef top loin -- 4 oz has 78 mg.  Frankfurter -- 1 frank (3.5 oz) has 77 mg.  Crab -- 3.5 oz has 71 mg.  Roasted chicken without skin, white meat -- 4 oz has 66 mg.  Light bologna -- 2 oz has 45 mg.  Deli-cut [place] -- 2 oz has 31 mg.  Canned tuna -- 3.5 oz has 31 mg.  Berniece Salines -- 1 oz has 29 mg.  Oysters and mussels (raw) -- 3.5 oz has 25 mg.  Mackerel -- 1 oz  has 22 mg.  Trout -- 1 oz has 20 mg.  Pork sausage -- 1 link (1 oz) has 17 mg.  Salmon -- 1 oz has 16 mg.  Tilapia -- 1 oz has 14 mg. Dairy  Soft-serve ice cream --  cup (4 oz) has 103 mg.  Whole-milk yogurt -- 1 cup (8 oz) has 29 mg.  Cheddar cheese -- 1 oz has 28 mg.  American cheese -- 1 oz has 28 mg.  Whole milk -- 1 cup (8 oz) has 23 mg.  2% milk -- 1 cup (8 oz) has 18 mg.  Cream cheese -- 1 tablespoon (Tbsp) has 15 mg.  Cottage cheese --  cup (4 oz) has 14 mg.  Low-fat (1%) milk -- 1 cup (8 oz) has 10 mg.  Sour cream -- 1 Tbsp has 8.5 mg.  Low-fat yogurt -- 1 cup (8 oz) has 8 mg.  Nonfat Greek yogurt -- 1 cup (8 oz) has 7 mg.  Half-and-half cream -- 1 Tbsp has 5 mg. Fats and oils  Cod liver oil -- 1 tablespoon (Tbsp) has 82 mg.  Butter -- 1 Tbsp has 15 mg.  Lard -- 1 Tbsp has 14 mg.  Bacon grease -- 1 Tbsp has 14 mg.  Mayonnaise -- 1 Tbsp has 5-10 mg.  Margarine -- 1 Tbsp has 3-10 mg. Exact amounts of cholesterol in these foods may vary depending on specific ingredients and brands.   Foods without cholesterol Most plant-based foods do not have cholesterol unless you combine them with a food that has cholesterol. Foods without cholesterol include:  Grains and cereals.  Vegetables.  Fruits.  Vegetable oils, such as olive, canola, and sunflower oil.  Legumes, such as peas, beans, and lentils.  Nuts and seeds.  Egg whites.   Summary  The body needs cholesterol in small amounts, but too much cholesterol can cause damage to the arteries and heart.  Most people should eat less than 200 milligrams (mg) of cholesterol a day. This information is not intended to replace advice given to you by your health care provider. Make sure you discuss any questions you have with your health care provider. Document Revised: 09/17/2019 Document Reviewed: 09/17/2019 Elsevier Patient Education  2021 [place].   High Cholesterol  High cholesterol is a  condition in which the blood has high levels of a white, waxy substance similar to fat (cholesterol). The liver makes all the cholesterol that the body needs. The human body needs small amounts of cholesterol to help build cells. A person gets extra or excess cholesterol from the food that he or she eats. The blood carries cholesterol from the liver to the rest of the body. If you have high cholesterol, deposits (plaques) may build up on the walls of your arteries. Arteries are the blood vessels that carry blood away from your heart. These plaques make the arteries narrow and stiff. Cholesterol plaques increase your risk for heart attack and stroke. Work with your health care provider to keep your cholesterol levels in a healthy range. What increases the risk? The following factors may make you more likely to develop this condition: Eating foods that are high in animal fat (saturated fat) or cholesterol. Being overweight. Not getting enough exercise. A family history of high cholesterol (familial hypercholesterolemia). Use of tobacco products. Having diabetes. What are the signs or symptoms? There are no symptoms of this condition. How is this diagnosed? This condition may be diagnosed based on the results of a blood test. If you are older than 85 years of age, your health care provider may check your cholesterol levels every 4-6 years. You may be checked more often if you have high cholesterol or other risk factors for heart disease. The blood test for cholesterol measures: "  Bad" cholesterol, or LDL cholesterol. This is the main type of cholesterol that causes heart disease. The desired level is less than 100 mg/dL. "Good" cholesterol, or HDL cholesterol. HDL helps protect against heart disease by cleaning the arteries and carrying the LDL to the liver for processing. The desired level for HDL is 60 mg/dL or higher. Triglycerides. These are fats that your body can store or burn for energy. The  desired level is less than 150 mg/dL. Total cholesterol. This measures the total amount of cholesterol in your blood and includes LDL, HDL, and triglycerides. The desired level is less than 200 mg/dL. How is this treated? This condition may be treated with: Diet changes. You may be asked to eat foods that have more fiber and less saturated fats or added sugar. Lifestyle changes. These may include regular exercise, maintaining a healthy weight, and quitting use of tobacco products. Medicines. These are given when diet and lifestyle changes have not worked. You may be prescribed a statin medicine to help lower your cholesterol levels. Follow these instructions at home: Eating and drinking Eat a healthy, balanced diet. This diet includes: Daily servings of a variety of fresh, frozen, or canned fruits and vegetables. Daily servings of whole grain foods that are rich in fiber. Foods that are low in saturated fats and trans fats. These include poultry and fish without skin, lean cuts of meat, and low-fat dairy products. A variety of fish, especially oily fish that contain omega-3 fatty acids. Aim to eat fish at least 2 times a week. Avoid foods and drinks that have added sugar. Use healthy cooking methods, such as roasting, grilling, broiling, baking, poaching, steaming, and stir-frying. Do not fry your food except for stir-frying.   Lifestyle Get regular exercise. Aim to exercise for a total of 150 minutes a week. Increase your activity level by doing activities such as gardening, walking, and taking the stairs. Do not use any products that contain nicotine or tobacco, such as cigarettes, e-cigarettes, and chewing tobacco. If you need help quitting, ask your health care provider.   General instructions Take over-the-counter and prescription medicines only as told by your health care provider. Keep all follow-up visits as told by your health care provider. This is important. Where to find more  information American Heart Association: www.heart.org National Heart, Lung, and Blood Institute: https://wilson-eaton.com/ Contact a health care provider if: You have trouble achieving or maintaining a healthy diet or weight. You are starting an exercise program. You are unable to stop smoking. Get help right away if: You have chest pain. You have trouble breathing. You have any symptoms of a stroke. "BE FAST" is an easy way to remember the main warning signs of a stroke: B - Balance. Signs are dizziness, sudden trouble walking, or loss of balance. E - Eyes. Signs are trouble seeing or a sudden change in vision. F - Face. Signs are sudden weakness or numbness of the face, or the face or eyelid drooping on one side. A - Arms. Signs are weakness or numbness in an arm. This happens suddenly and usually on one side of the body. S - Speech. Signs are sudden trouble speaking, slurred speech, or trouble understanding what people say. T - Time. Time to call emergency services. Write down what time symptoms started. You have other signs of a stroke, such as: A sudden, severe headache with no known cause. Nausea or vomiting. Seizure. These symptoms may represent a serious problem that is an emergency. Do not wait  to see if the symptoms will go away. Get medical help right away. Call your local emergency services (911 in the [place] Do not drive yourself to the hospital. Summary Cholesterol plaques increase your risk for heart attack and stroke. Work with your health care provider to keep your cholesterol levels in a healthy range. Eat a healthy, balanced diet, get regular exercise, and maintain a healthy weight. Do not use any products that contain nicotine or tobacco, such as cigarettes, e-cigarettes, and chewing tobacco. Get help right away if you have any symptoms of a stroke. This information is not intended to replace advice given to you by your health care provider. Make sure you discuss any questions  you have with your health care provider. Document Revised: 03/26/2019 Document Reviewed: 03/26/2019 Elsevier Patient Education  2021 [employer].

## 2020-07-11 ENCOUNTER — Encounter: Payer: Self-pay | Admitting: Family Medicine

## 2020-07-11 LAB — COMPREHENSIVE METABOLIC PANEL
ALT: 10 IU/L (ref 0–32)
AST: 19 IU/L (ref 0–40)
Albumin/Globulin Ratio: 2.1 (ref 1.2–2.2)
Albumin: 4.4 g/dL (ref 3.6–4.6)
Alkaline Phosphatase: 74 IU/L (ref 44–121)
BUN/Creatinine Ratio: 17 (ref 12–28)
BUN: 11 mg/dL (ref 8–27)
Bilirubin Total: 0.7 mg/dL (ref 0.0–1.2)
CO2: 23 mmol/L (ref 20–29)
Calcium: 9.4 mg/dL (ref 8.7–10.3)
Chloride: 96 mmol/L (ref 96–106)
Creatinine, Ser: 0.65 mg/dL (ref 0.57–1.00)
Globulin, Total: 2.1 g/dL (ref 1.5–4.5)
Glucose: 102 mg/dL — ABNORMAL HIGH (ref 65–99)
Potassium: 4.9 mmol/L (ref 3.5–5.2)
Sodium: 133 mmol/L — ABNORMAL LOW (ref 134–144)
Total Protein: 6.5 g/dL (ref 6.0–8.5)
eGFR: 85 mL/min/{1.73_m2} (ref >59)

## 2020-07-11 LAB — CBC WITH DIFFERENTIAL/PLATELET
Basophils Absolute: 0.1 10*3/uL (ref 0.0–0.2)
Basos: 1 %
EOS (ABSOLUTE): 0.3 10*3/uL (ref 0.0–0.4)
Eos: 4 %
Hematocrit: 45.7 % (ref 34.0–46.6)
Hemoglobin: 14.9 g/dL (ref 11.1–15.9)
Immature Grans (Abs): 0 10*3/uL (ref 0.0–0.1)
Immature Granulocytes: 0 %
Lymphocytes Absolute: 2.2 10*3/uL (ref 0.7–3.1)
Lymphs: 31 %
MCH: 28.8 pg (ref 26.6–33.0)
MCHC: 32.6 g/dL (ref 31.5–35.7)
MCV: 88 fL (ref 79–97)
Monocytes Absolute: 0.5 10*3/uL (ref 0.1–0.9)
Monocytes: 7 %
Neutrophils Absolute: 4 10*3/uL (ref 1.4–7.0)
Neutrophils: 57 %
Platelets: 237 10*3/uL (ref 150–450)
RBC: 5.18 x10E6/uL (ref 3.77–5.28)
RDW: 12.9 % (ref 11.7–15.4)
WBC: 7 10*3/uL (ref 3.4–10.8)

## 2020-07-11 LAB — TSH: TSH: 3.83 u[IU]/mL (ref 0.450–4.500)

## 2020-07-11 LAB — LIPID PANEL
Chol/HDL Ratio: 4.3 ratio (ref 0.0–4.4)
Cholesterol, Total: 281 mg/dL — ABNORMAL HIGH (ref 100–199)
HDL: 65 mg/dL (ref >39)
LDL Chol Calc (NIH): 192 mg/dL — ABNORMAL HIGH (ref 0–99)
Triglycerides: 132 mg/dL (ref 0–149)
VLDL Cholesterol Cal: 24 mg/dL (ref 5–40)

## 2020-07-11 LAB — VITAMIN D 25 HYDROXY (VIT D DEFICIENCY, FRACTURES): Vit D, 25-Hydroxy: 20.6 ng/mL — ABNORMAL LOW (ref 30.0–100.0)

## 2020-07-14 ENCOUNTER — Other Ambulatory Visit: Payer: Self-pay | Admitting: *Deleted

## 2020-07-14 ENCOUNTER — Ambulatory Visit: Payer: Self-pay | Admitting: Family Medicine

## 2020-07-14 DIAGNOSIS — H401133 Primary open-angle glaucoma, bilateral, severe stage: Secondary | ICD-10-CM | POA: Diagnosis not present

## 2020-07-14 MED ORDER — ERGOCALCIFEROL 1.25 MG (50000 UT) PO CAPS: 50000.0000 [IU] | ORAL_CAPSULE | ORAL | 0 refills | Status: DC

## 2020-07-15 ENCOUNTER — Encounter: Payer: Self-pay | Admitting: Family Medicine

## 2020-07-30 ENCOUNTER — Ambulatory Visit (INDEPENDENT_AMBULATORY_CARE_PROVIDER_SITE_OTHER): Payer: Medicare HMO

## 2020-07-30 ENCOUNTER — Other Ambulatory Visit: Payer: Self-pay

## 2020-07-30 DIAGNOSIS — Z23 Encounter for immunization: Secondary | ICD-10-CM | POA: Diagnosis not present

## 2020-08-21 ENCOUNTER — Ambulatory Visit (INDEPENDENT_AMBULATORY_CARE_PROVIDER_SITE_OTHER): Payer: Medicare HMO

## 2020-08-21 ENCOUNTER — Other Ambulatory Visit: Payer: Self-pay

## 2020-08-21 DIAGNOSIS — Z23 Encounter for immunization: Secondary | ICD-10-CM | POA: Diagnosis not present

## 2020-10-03 ENCOUNTER — Other Ambulatory Visit: Payer: Self-pay | Admitting: Family Medicine

## 2020-10-03 NOTE — Telephone Encounter (Signed)
Per dr. Tomi Bamberger lab visit. After rx start over the counter vitamin D

## 2020-10-23 DIAGNOSIS — H401133 Primary open-angle glaucoma, bilateral, severe stage: Secondary | ICD-10-CM | POA: Diagnosis not present

## 2021-01-01 ENCOUNTER — Other Ambulatory Visit: Payer: Medicare HMO

## 2021-01-09 DIAGNOSIS — H401133 Primary open-angle glaucoma, bilateral, severe stage: Secondary | ICD-10-CM | POA: Diagnosis not present

## 2021-02-24 DIAGNOSIS — H401133 Primary open-angle glaucoma, bilateral, severe stage: Secondary | ICD-10-CM | POA: Diagnosis not present

## 2021-04-16 ENCOUNTER — Ambulatory Visit
Admission: RE | Admit: 2021-04-16 | Discharge: 2021-04-16 | Disposition: A | Discharge status: Home / Self Care | Payer: Medicare HMO | Source: Ambulatory Visit | Attending: Family Medicine | Admitting: Family Medicine

## 2021-04-16 DIAGNOSIS — M810 Age-related osteoporosis without current pathological fracture: Secondary | ICD-10-CM | POA: Diagnosis not present

## 2021-04-20 ENCOUNTER — Other Ambulatory Visit: Payer: Self-pay | Admitting: *Deleted

## 2021-04-20 DIAGNOSIS — M810 Age-related osteoporosis without current pathological fracture: Secondary | ICD-10-CM

## 2021-04-24 DIAGNOSIS — H401133 Primary open-angle glaucoma, bilateral, severe stage: Secondary | ICD-10-CM | POA: Diagnosis not present

## 2021-04-24 DIAGNOSIS — Z961 Presence of intraocular lens: Secondary | ICD-10-CM | POA: Diagnosis not present

## 2021-07-07 ENCOUNTER — Telehealth: Payer: Self-pay | Admitting: Family Medicine

## 2021-07-07 NOTE — Telephone Encounter (Signed)
Kieth Brightly @ [place] [phone]  called They have an appointment for Tabitha Ramos on March 9th at 2:15, when they called her she said she did not want to make an appointment until she saw Dr. Tomi Bamberger again which will be March 6th.  They wanted to see what you wanted to do

## 2021-07-08 NOTE — Telephone Encounter (Signed)
Called patient again (as we have done this several times now-noted in actual referral) and tried to stress to her the importance of why you want her to go. She said it is a hassle for her and she is just not used to all of this. She had me call back and cancel, Dr. Almetta Lovely office said they were canceling the referral. Just an FYI as patient will be here Monday. ?

## 2021-07-10 NOTE — Telephone Encounter (Signed)
Noted. Will discuss osteoporosis at her visit. ?

## 2021-07-12 DIAGNOSIS — M810 Age-related osteoporosis without current pathological fracture: Secondary | ICD-10-CM | POA: Insufficient documentation

## 2021-07-12 DIAGNOSIS — E782 Mixed hyperlipidemia: Secondary | ICD-10-CM | POA: Insufficient documentation

## 2021-07-12 DIAGNOSIS — E042 Nontoxic multinodular goiter: Secondary | ICD-10-CM | POA: Insufficient documentation

## 2021-07-12 NOTE — Progress Notes (Signed)
Chief Complaint  Patient presents with   Medicare Wellness    Nonfasting AWV/CPE. Did not get flu shot, would like covid booster today. No concerns.     Tabitha Ramos is a 88 y.o. female who presents for annual physical exam and Medicare wellness visit, as well as follow up on chronic problems. She is accompanied by her niece, Jana Half. She is not fasting today, ate 3 hours ago.  Osteoporosis:  She recalls being treated with Boniva--can't recall how long she took it, but needed to stop it prior to pulling her teeth.  It was never restarted. She takes a calcium supplement and vitamin D. She gets no regular weight-bearing exercise. DEXA in 03/2012 showed T-2.9 at spine, -3.5 at L fem neck. She had DEXA repeated in 04/2021, which showed significant decline.  T was -5.1 at L forearm radius, T-4.1 at spine, -4.3 at R femur Due to the severity of her osteoporosis, she was referred to endocrinology for evaluation (to ensure no underlying metabolic cause). The appointment was cancelled, as the patient preferred to discuss with PCP first.   Hyperlipidemia: noted to have significantly elevated cholesterol when reviewing records from Dr. Nyoka Cowden (03/2019 TC 292, LDL 195, TG 164).  She has never been treated for her cholesterol. Lipids remained about the same at her check last year. At that time she reported the following in her diet: 2% milk, +cheese and ice cream. Infrequent ground beef, mainly eats chicken and fish.  +mayo and egg with her tuna. She was given information on low cholesterol diet and is due for recheck. (Unfortunately she isn't fasting today).  Since last visit, she reports she cut out beef, pork, mostly eating fruits and vegetables.  She eats some canned salmon and tuna. She cut out mayonnaise and liver pudding. She now uses 1% milk.  Infrequent ice cream (2-3x/month).  She cut out other dairy products. She attributes her weight loss to cutting out a lot of foods related to her cholesterol,  also reports some decrease in appetite.  Lab Results  Component Value Date   CHOL 281 (H) 07/10/2020   HDL 65 07/10/2020   LDLCALC 192 (H) 07/10/2020   TRIG 132 07/10/2020   CHOLHDL 4.3 07/10/2020   Vitamin D deficiency: Level was 20.6 last year. She was treated with 12 weeks of prescription vitamin D. She is currently taking 1000 IU daily (plus has D with her calcium supplement and MVI).  Impaired fasting glucose:  Fasting glucose was 102 last year. Due for recheck. She tries to limit sweets.   Immunization History  Administered Date(s) Administered   Influenza, High Dose Seasonal PF 04/19/2013, 04/19/2014, 02/12/2015, 03/25/2016, 03/30/2017, 04/03/2018, 04/09/2019   PFIZER Comirnaty(Gray Top)Covid-19 Tri-Sucrose Vaccine 07/30/2020, 08/21/2020   Pneumococcal Conjugate-13 04/09/2014   Pneumococcal Polysaccharide-23 03/10/2005, 07/10/2020   Tdap 03/11/2011   Zoster Recombinat (Shingrix) 03/10/2017   Didn't get flu shot this year. Doesn't want. Reports she doesn't really get out much. Last Pap smear: s/p hysterectomy Last mammogram: 03/2012, declines. Last colonoscopy: 2006, hyperplastic polyp (rec 2011 repeat); h/o adenomatous polyp 2002) Last DEXA: 04/2021 T was -5.1 at L forearm radius, -4.1 at spine, -4.3 at R femur (prior check was 05/2011 T-2.9 spine, -3.5 L fem neck) Dentist: 2018; had all teeth pulled, got dentures made, but they never fit right Ophtho: goes regularly (glaucoma) Exercise:  Exercises while holding cans, leg exercises (30 minutes/day), and walks around her condo. No longer walks outside (fear of falling on the cement).   Patient  Care Team: Rita Ohara, MD as PCP - General (Family Medicine) GI: Dr. Olevia Perches (retired) Ophtho: Dr. Jola Schmidt Dentist: Dr. Elbert Ewings (no longer sees)  Depression Screening: Depression screen [facility] 2/9 07/13/2021 07/10/2020  Decreased Interest 1 0  Down, Depressed, Hopeless 0 0  PHQ - 2 Score 1 0     Falls screen:  Fall Risk   07/13/2021 07/10/2020  Falls in the past year? 0 1  Number falls in past yr: 0 0  Injury with Fall? 0 0  Comment - hurt tailbone  Risk for fall due to : No Fall Risks -  Follow up Falls evaluation completed -     Functional Status Survey: Does the patient have difficulty seeing, even when wearing glasses/contacts?: Yes (glaucoma) Does the patient have difficulty concentrating, remembering, or making decisions?: Yes (only if she tries to remember too much) Does the patient have difficulty walking or climbing stairs?: Yes Does the patient have difficulty dressing or bathing?: No Does the patient have difficulty doing errands alone such as visiting a doctor's office or shopping?: Yes (does not drive)  Some trouble with names only. Some decreased hearing (mild). Significant decrease in vision of R eye from glaucoma.   End of Life Discussion:  Patient does not have a living will and medical power of attorney. Forms were given last year. She is still discussing with her grandson, who was hesitant to be listed as second person on form.  PMH, PSH, SH and FH were reviewed and updated  Outpatient Encounter Medications as of 07/13/2021  Medication Sig Note   Ascorbic Acid (VITAMIN C) 1000 MG tablet Take 1,000 mg by mouth daily.    b complex vitamins capsule Take 1 capsule by mouth daily.    Calcium Carb-Cholecalciferol (CALCIUM 500 +D PO) Take 1 tablet by mouth daily.    Cholecalciferol 25 MCG (1000 UT) capsule Take by mouth.    dorzolamide-timolol (COSOPT) 22.3-6.8 MG/ML ophthalmic solution 1 drop 2 (two) times daily.    Menaquinone-7 (VITAMIN K2) 100 MCG CAPS Take 1 capsule by mouth daily.    Multiple Vitamin (MULTIVITAMIN) tablet Take 1 tablet by mouth daily.    Multiple Vitamins-Minerals (ZINC PO) Take 1 tablet by mouth daily. 07/10/2020: With calcium and mg   pilocarpine (PILOCAR) 2 % ophthalmic solution INSTILL 1 DROP IN BOTH EYES 4 TIMES A DAY    ROCKLATAN 0.02-0.005 % SOLN Apply 1 drop to  eye at bedtime.    [DISCONTINUED] ergocalciferol (VITAMIN D2) 1.25 MG (50000 UT) capsule Take 1 capsule (50,000 Units total) by mouth once a week.    No facility-administered encounter medications on file as of 07/13/2021.   No Known Allergies  ROS:  Patient denies fever, headaches, URI symptoms, breast concerns, chest pain, palpitations, dizziness, syncope, dyspnea on exertion, cough, swelling, nausea, vomiting, diarrhea, constipation, abdominal pain, melena, hematochezia, indigestion/heartburn, hematuria, incontinence, dysuria, vaginal bleeding, discharge, odor or itch, genital lesions, joint pains, numbness, tingling, weakness, tremor, suspicious skin lesions, abnormal bleeding, or enlarged lymph nodes.  Vision loss in R eye (also can't see red color with that eye).  Some hearing loss. Occasionally feels slightly woozy (when she gets up quickly) She reports some runny nose when outside (allergies, pollen).  Occasional constipation, r/b prune juice Easy bruising, denies bleeding. Occasionally feels nervous, denies depression. +weight loss and slight decrease in appetite.   PHYSICAL EXAM:  BP 118/64    Pulse 72    Ht '5\' 1"'$  (1.549 m)    Wt 85 lb 6.4  oz (38.7 kg)    BMI 16.14 kg/m   Wt Readings from Last 3 Encounters:  07/13/21 85 lb 6.4 oz (38.7 kg)  07/10/20 101 lb 9.6 oz (46.1 kg)   General Appearance:    Alert, cooperative, no distress, thin elderly female in good spirits.  Some intermittent throat-clearing during visit. Patient declined to get undressed for the visit.  Head:    Normocephalic, without obvious abnormality, atraumatic  Eyes:    PERRL, conjunctiva/corneas clear, EOM's intact, fundi benign, though not well visualized (miotic pupils)  Ears:    Normal TM's and external ear canals  Nose:   Not examined, wearing mask due to COVID-19 pandemic  Throat:   Not examined, wearing mask due to COVID-19 pandemic  Neck:   Supple, no lymphadenopathy;  thyroid:  no enlargement/  tenderness/nodules; no carotid bruit or JVD  Back:    Spine nontender, no curvature, ROM normal, no CVA  tenderness.   Lungs:     Clear to auscultation bilaterally without wheezes, rales or     ronchi; respirations unlabored  Chest Wall:    No tenderness or deformity   Heart:    Regular rate and rhythm, S1 and S2 normal, no murmur, rub   or gallop  Breast Exam:    Exam declined by patient  Abdomen:     Soft, non-tender, nondistended, normoactive bowel sounds,    no masses, no hepatosplenomegaly  Genitalia:    Exam declined by patient  Rectal:   Exam declined by patient  Extremities:   No clubbing, cyanosis or edema  Pulses:   2+ and symmetric all extremities  Skin:   Skin color, texture, turgor normal, no rashes or suspicious lesions. Multiple purpura at L wrist.  There are scattered cherry angiomas, SK's and skin tags. Skin exam limited due to patient not being undressed--did check her back.  Lymph nodes:   Cervical, supraclavicular nodes normal  Neurologic:   She is alert and oriented. Normal strength, sensation and gait; reflexes 2+ and symmetric throughout          Psych:   Normal mood, affect, hygiene and grooming.     ASSESSMENT/PLAN:  Annual physical exam  Medicare annual wellness visit, subsequent  Osteoporosis without current pathological fracture, unspecified osteoporosis type - significant decline in bone density, high risk for fracture. Disc Evenity f/b Prolia; if not affordable, start w/Prolia. Will check costs. Cont Ca, D, weights - Plan: VITAMIN D 25 Hydroxy (Vit-D Deficiency, Fractures), TSH  Mixed hyperlipidemia - Due for recheck (nonfasting today). Cont low cholesterol diet - Plan: Lipid panel  Elevated fasting glucose - Plan: Hemoglobin A1c, Comprehensive metabolic panel  Senile purpura (HCC) - L wrist, related to age - Plan: CBC with Differential/Platelet  Multinodular thyroid - benign per [place] 11/2018  Need for COVID-19 vaccine - Plan: Pfizer Covid-19 Vaccine  Bivalent Booster  Weight loss - pt reports is due to significant change in diet. Reviewed importance of adequate nutrition. 3 meals, 2 snacks, protein drinks as supplement, not replacement - Plan: CBC with Differential/Platelet, Comprehensive metabolic panel, TSH  Protein-calorie malnutrition, unspecified severity ([place]) - Will check labs to assess for underlying cause (vs due to inadequate intake).  f/u 3 mos, may need to send to dietician  Vitamin D deficiency - s/p 12 weeks rx, currently taking 1000 IU daily.  This may have contributed to worsening osteoporosis.  Due for recheck - Plan: VITAMIN D 25 Hydroxy (Vit-D Deficiency, Fractures)  Kathy--to check cost of Evenity and Prolia, so  that we can start treatment of osteoporosis to try and prevent fractures. She is at high risk.     Bivalent COVID booster given.  Declined flu shot (encouraged to get in the Fall--she would be willing to get at our office, too hard to make appt to get from pharmacy, per pt). Reminded her that she needs to get the Tdap from the pharmacy, and to consider 2nd Shingrix vaccine from pharmacy as well.  Needs to wait 2 weeks from today's vaccine.   Discussed monthly self breast exams and yearly mammograms (optional given her age, she declines); at least 30 minutes of aerobic activity at least 5 days/week and weight-bearing exercise 2x/week; proper sunscreen use reviewed; healthy diet, including goals of calcium and vitamin D intake and alcohol recommendations (less than or equal to 1 drink/day) reviewed; regular seatbelt use; changing batteries in smoke detectors, use of carbon monoxide detectors.  Immunization recommendations discussed--Tdap due, to get from pharmacy. 2nd Shingrix recommended (from pharmacy). Yearly flu shots are recommended in the Fall. Colonoscopy recommendations reviewed--no longer needed based on age, for routine screening.  To let us know if any symptoms develop.  MOST form completed, Full Code, Full  Care Given paperwork for Living Will and Healthcare power of attorney last year; reminded to fill out and requested her to give [place] copies once completed/notarized.  f/u within 3 months to f/u on weight, possibly sooner.  Medicare Attestation I have personally reviewed: The patient's medical and social history Their use of alcohol, tobacco or illicit drugs Their current medications and supplements The patient's functional ability including ADLs,fall risks, home safety risks, cognitive, and hearing and visual impairment Diet and physical activities Evidence for depression or mood disorders  The patient's weight, height, BMI have been recorded in the chart.  I have made referrals, counseling, and provided education to the patient based on review of the above and I have provided the patient with a written personalized care plan for preventive services.     Vikki Ports, MD

## 2021-07-12 NOTE — Patient Instructions (Addendum)
?HEALTH MAINTENANCE RECOMMENDATIONS: ? ?It is recommended that you get at least 30 minutes of aerobic exercise at least 5 days/week (for weight loss, you may need as much as 60-90 minutes). This can be any activity that gets your heart rate up. This can be divided in 10-15 minute intervals if needed, but try and build up your endurance at least once a week.  Weight bearing exercise is also recommended twice weekly. ? ?Eat a healthy diet with lots of vegetables, fruits and fiber.  "Colorful" foods have a lot of vitamins (ie green vegetables, tomatoes, red peppers, etc).  Limit sweet tea, regular sodas and alcoholic beverages, all of which has a lot of calories and sugar.  Up to 1 alcoholic drink daily may be beneficial for women (unless trying to lose weight, watch sugars).  Drink a lot of water. ? ?Calcium recommendations are 1200-1500 mg daily (1500 mg for postmenopausal women or women without ovaries), and vitamin D 1000 IU daily.  This should be obtained from diet and/or supplements (vitamins), and calcium should not be taken all at once, but in divided doses. ? ?Monthly self breast exams and yearly mammograms for women over the age of 72 is recommended. ? ?Sunscreen of at least SPF 30 should be used on all sun-exposed parts of the skin when outside between the hours of 10 am and 4 pm (not just when at beach or pool, but even with exercise, golf, tennis, and yard work!)  Use a sunscreen that says "broad spectrum" so it covers both UVA and UVB rays, and make sure to reapply every 1-2 hours. ? ?Remember to change the batteries in your smoke detectors when changing your clock times in the spring and fall. Carbon monoxide detectors are recommended for your home. ? ?Use your seat belt every time you are in a car, and please drive safely and not be distracted with cell phones and texting while driving. ? ? ?[name] , ?Thank you for taking time to come for your Medicare Wellness Visit. I appreciate your ongoing  commitment to your health goals. Please review the following plan we discussed and let me know if I can assist you in the future.  ? ?This is a list of the screening recommended for you and due dates:  ?Health Maintenance  ?Topic Date Due  ? Zoster (Shingles) Vaccine (2 of 2) 05/05/2017  ? COVID-19 Vaccine (3 - Pfizer risk series) 09/18/2020  ? Flu Shot  12/08/2020  ? Tetanus Vaccine  03/10/2021  ? Pneumonia Vaccine  Completed  ? DEXA scan (bone density measurement)  Completed  ? HPV Vaccine  Aged Out  ? ?Yearly flu shots are recommended in the Fall (September/October). You can get this from our office, if that is easier than getting it from the pharmacy. ? ?Please get your tetanus booster (TdaP) from the pharmacy. ?You've only had 1 shingles dose, and you're supposed to get two. Please get another Shingrix vaccine from the pharmacy.  This should either be given the same day as the tetanus, or two weeks later. ? ?You need to wait 2 weeks from today's COVID booster before getting your tetanus or shingles vaccine. ? ?Please return your healthcare power of attorney and living will forms once they are completed and notarized. ? ?We discussed Evenity and Prolia injections to treat your osteoporosis, but will see if these are affordable options or not. ? ?You will need to return for fasting labs (no breakfast or anything other than water or black  coffee prior to coming for your labwork). ? ? ? ?

## 2021-07-13 ENCOUNTER — Other Ambulatory Visit: Payer: Self-pay

## 2021-07-13 ENCOUNTER — Ambulatory Visit (INDEPENDENT_AMBULATORY_CARE_PROVIDER_SITE_OTHER): Payer: Medicare HMO | Admitting: Family Medicine

## 2021-07-13 ENCOUNTER — Encounter: Payer: Self-pay | Admitting: Family Medicine

## 2021-07-13 VITALS — BP 118/64 | HR 72 | Ht 61.0 in | Wt 85.4 lb

## 2021-07-13 DIAGNOSIS — D692 Other nonthrombocytopenic purpura: Secondary | ICD-10-CM

## 2021-07-13 DIAGNOSIS — Z0000 Encounter for general adult medical examination without abnormal findings: Secondary | ICD-10-CM | POA: Diagnosis not present

## 2021-07-13 DIAGNOSIS — E042 Nontoxic multinodular goiter: Secondary | ICD-10-CM | POA: Diagnosis not present

## 2021-07-13 DIAGNOSIS — E782 Mixed hyperlipidemia: Secondary | ICD-10-CM

## 2021-07-13 DIAGNOSIS — E46 Unspecified protein-calorie malnutrition: Secondary | ICD-10-CM | POA: Diagnosis not present

## 2021-07-13 DIAGNOSIS — E559 Vitamin D deficiency, unspecified: Secondary | ICD-10-CM

## 2021-07-13 DIAGNOSIS — R7301 Impaired fasting glucose: Secondary | ICD-10-CM | POA: Diagnosis not present

## 2021-07-13 DIAGNOSIS — R634 Abnormal weight loss: Secondary | ICD-10-CM

## 2021-07-13 DIAGNOSIS — M810 Age-related osteoporosis without current pathological fracture: Secondary | ICD-10-CM

## 2021-07-13 DIAGNOSIS — Z23 Encounter for immunization: Secondary | ICD-10-CM | POA: Diagnosis not present

## 2021-07-14 ENCOUNTER — Other Ambulatory Visit: Payer: Medicare HMO

## 2021-07-14 DIAGNOSIS — E559 Vitamin D deficiency, unspecified: Secondary | ICD-10-CM | POA: Diagnosis not present

## 2021-07-14 DIAGNOSIS — D692 Other nonthrombocytopenic purpura: Secondary | ICD-10-CM | POA: Diagnosis not present

## 2021-07-14 DIAGNOSIS — R7301 Impaired fasting glucose: Secondary | ICD-10-CM

## 2021-07-14 DIAGNOSIS — E782 Mixed hyperlipidemia: Secondary | ICD-10-CM | POA: Diagnosis not present

## 2021-07-14 DIAGNOSIS — M810 Age-related osteoporosis without current pathological fracture: Secondary | ICD-10-CM

## 2021-07-14 DIAGNOSIS — R634 Abnormal weight loss: Secondary | ICD-10-CM | POA: Diagnosis not present

## 2021-07-14 LAB — LIPID PANEL

## 2021-07-15 LAB — COMPREHENSIVE METABOLIC PANEL
ALT: 14 IU/L (ref 0–32)
AST: 24 IU/L (ref 0–40)
Albumin/Globulin Ratio: 1.8 (ref 1.2–2.2)
Albumin: 4.1 g/dL (ref 3.6–4.6)
Alkaline Phosphatase: 70 IU/L (ref 44–121)
BUN/Creatinine Ratio: 22 (ref 12–28)
BUN: 16 mg/dL (ref 8–27)
Bilirubin Total: 0.9 mg/dL (ref 0.0–1.2)
CO2: 25 mmol/L (ref 20–29)
Calcium: 9.8 mg/dL (ref 8.7–10.3)
Chloride: 97 mmol/L (ref 96–106)
Creatinine, Ser: 0.73 mg/dL (ref 0.57–1.00)
Globulin, Total: 2.3 g/dL (ref 1.5–4.5)
Glucose: 112 mg/dL — ABNORMAL HIGH (ref 70–99)
Potassium: 4.9 mmol/L (ref 3.5–5.2)
Sodium: 136 mmol/L (ref 134–144)
Total Protein: 6.4 g/dL (ref 6.0–8.5)
eGFR: 79 mL/min/{1.73_m2} (ref >59)

## 2021-07-15 LAB — CBC WITH DIFFERENTIAL/PLATELET
Basophils Absolute: 0 10*3/uL (ref 0.0–0.2)
Basos: 1 %
EOS (ABSOLUTE): 0.2 10*3/uL (ref 0.0–0.4)
Eos: 3 %
Hematocrit: 43.5 % (ref 34.0–46.6)
Hemoglobin: 14.3 g/dL (ref 11.1–15.9)
Immature Grans (Abs): 0 10*3/uL (ref 0.0–0.1)
Immature Granulocytes: 0 %
Lymphocytes Absolute: 1.6 10*3/uL (ref 0.7–3.1)
Lymphs: 23 %
MCH: 29.1 pg (ref 26.6–33.0)
MCHC: 32.9 g/dL (ref 31.5–35.7)
MCV: 88 fL (ref 79–97)
Monocytes Absolute: 0.4 10*3/uL (ref 0.1–0.9)
Monocytes: 6 %
Neutrophils Absolute: 4.5 10*3/uL (ref 1.4–7.0)
Neutrophils: 67 %
Platelets: 204 10*3/uL (ref 150–450)
RBC: 4.92 x10E6/uL (ref 3.77–5.28)
RDW: 12.9 % (ref 11.7–15.4)
WBC: 6.7 10*3/uL (ref 3.4–10.8)

## 2021-07-15 LAB — LIPID PANEL
Chol/HDL Ratio: 3.1 ratio (ref 0.0–4.4)
Cholesterol, Total: 199 mg/dL (ref 100–199)
HDL: 65 mg/dL (ref >39)
LDL Chol Calc (NIH): 119 mg/dL — ABNORMAL HIGH (ref 0–99)
Triglycerides: 85 mg/dL (ref 0–149)
VLDL Cholesterol Cal: 15 mg/dL (ref 5–40)

## 2021-07-15 LAB — TSH: TSH: 3.09 u[IU]/mL (ref 0.450–4.500)

## 2021-07-15 LAB — HEMOGLOBIN A1C
Est. average glucose Bld gHb Est-mCnc: 128 mg/dL
Hgb A1c MFr Bld: 6.1 % — ABNORMAL HIGH (ref 4.8–5.6)

## 2021-07-15 LAB — VITAMIN D 25 HYDROXY (VIT D DEFICIENCY, FRACTURES): Vit D, 25-Hydroxy: 66.6 ng/mL (ref 30.0–100.0)

## 2021-07-15 NOTE — Progress Notes (Signed)
Please advise pt of results-- ?She has pre-diabetes (fasting sugar was 112, A1c 6.1%).  Limiting sweets/sugar/carbs and daily exercise helps keep the sugars down.  I do not think we need to start any medications, just monitor periodically. ?Her cholesterol is SO MUCH BETTER!! ?BUT--because she lost so much weight, cutting back on so much of her food related to cholesterol, I'm afraid she will lose more when she starts watching her sugars and carbs.  For this reason, please refer her to a dietician (dx hypercholesterolemia, pre-diabetes, underweight (see prior dx's from visit), to HELP her know what she should and should NOT eat, so that we can put some weight back on her, rather than lose more. ?Electrolytes, liver and kidney tests, blood counts and thyroid tests are all normal. ?Vitamin D level is now very good, continue the daily supplement she is taking. ?Offer to mail her copy of labs, if she wants.

## 2021-07-16 ENCOUNTER — Other Ambulatory Visit: Payer: Self-pay | Admitting: *Deleted

## 2021-07-16 ENCOUNTER — Telehealth: Payer: Self-pay | Admitting: *Deleted

## 2021-07-16 DIAGNOSIS — R636 Underweight: Secondary | ICD-10-CM

## 2021-07-16 DIAGNOSIS — E7800 Pure hypercholesterolemia, unspecified: Secondary | ICD-10-CM

## 2021-07-16 DIAGNOSIS — R7303 Prediabetes: Secondary | ICD-10-CM

## 2021-07-16 NOTE — Telephone Encounter (Signed)
Did you use protein-calorie malnutrition as a diagnosis?  I would think that and Frailty put her at high risk, and perhaps [facility] could help (or does her insurance not work with [facility]?)  ?Maybe [facility] has assistance available? ? ?

## 2021-07-16 NOTE — Telephone Encounter (Signed)
Spoke with [facility] and patient qualifies, referral put in and patient advised.  ?

## 2021-07-16 NOTE — Telephone Encounter (Signed)
Patient called and said that [facility] did call her to schedule and they let her know that her insurance would NOT cover this. It would be $300 out of pocket to start and patient said she cannot afford. The person told her in order for insurance to cover she would need to have a diagnosis like "kidney failure," not just to come and talk about nutrition. Patient wanted you to know she is very sorry that she cannot afford this.  ?

## 2021-07-17 NOTE — Patient Outreach (Signed)
[facility]) Care Management ? ?07/17/2021 ? ?Tabitha Ramos ?1930-07-09 ?[id number] ? ? ?Received referral for help with getting dietary assistance for MD office. Sent referral to Care Guides for follow up. ? ?Philmore Pali ?[place] Management Assistant ?[phone] ? ?

## 2021-07-22 ENCOUNTER — Other Ambulatory Visit: Payer: Self-pay | Admitting: *Deleted

## 2021-07-22 DIAGNOSIS — R54 Age-related physical debility: Secondary | ICD-10-CM

## 2021-07-22 DIAGNOSIS — R7303 Prediabetes: Secondary | ICD-10-CM

## 2021-07-22 DIAGNOSIS — R636 Underweight: Secondary | ICD-10-CM

## 2021-07-22 DIAGNOSIS — E46 Unspecified protein-calorie malnutrition: Secondary | ICD-10-CM

## 2021-07-23 ENCOUNTER — Telehealth: Payer: Self-pay | Admitting: *Deleted

## 2021-07-23 NOTE — Chronic Care Management (AMB) (Signed)
?  Chronic Care Management  ? ?Outreach Note ? ?07/23/2021 ?Name: Tabitha Ramos MRN: [id number] DOB: 1930/05/27 ? ?Tabitha Ramos is a 88 y.o. year old female who is a primary care patient of Rita Ohara, MD. I reached out to Tabitha Ramos by phone today in response to a referral sent by Tabitha Ramos's primary care provider. ? ?An unsuccessful telephone outreach was attempted today. The patient was referred to the case management team for assistance with care management and care coordination.  ? ?Follow Up Plan: A HIPAA compliant phone message was left for the patient providing contact information and requesting a return call. The care management team will reach out to the patient again over the next 7 days. If patient returns call to provider office, please advise to call [place] at [phone]. ? ?Tabitha Ramos  ?Care Guide, Embedded Care Coordination ?[place]  Care Management  ?Direct Dial: [phone] ? ?

## 2021-07-28 DIAGNOSIS — H401133 Primary open-angle glaucoma, bilateral, severe stage: Secondary | ICD-10-CM | POA: Diagnosis not present

## 2021-07-28 NOTE — Chronic Care Management (AMB) (Signed)
?  Chronic Care Management  ? ?Outreach Note ? ?07/28/2021 ?Name: CAREEN MAUCH MRN: [id number] DOB: 04/03/1931 ? ?RICKAYLA WIELAND is a 88 y.o. year old female who is a primary care patient of Rita Ohara, MD. I reached out to Verl Dicker by phone today in response to a referral sent by Ms. Van Clines Schuman's primary care provider. ? ?A second unsuccessful telephone outreach was attempted today. The patient was referred to the case management team for assistance with care management and care coordination.  ? ?Follow Up Plan: The care management team will reach out to the patient again over the next 7 days.  ?If patient returns call to provider office, please advise to call [place]* at [phone].* ? ?[name]  ?Care Guide, Embedded Care Coordination ?[place]  Care Management  ?Direct Dial: [phone] ? ?

## 2021-08-05 ENCOUNTER — Telehealth: Payer: Self-pay | Admitting: Family Medicine

## 2021-08-05 NOTE — Chronic Care Management (AMB) (Signed)
?  Chronic Care Management  ? ?Outreach Note ? ?08/05/2021 ?Name: TEKLA MALACHOWSKI MRN: [id number] DOB: [birth year] ? ?FARIA CASELLA is a 88 y.o. year old female who is a primary care patient of Rita Ohara, MD. I reached out to Verl Dicker by phone today in response to a referral sent by Ms. Van Clines Schaffert's primary care provider. ? ?An unsuccessful telephone outreach was attempted today. The patient was referred to the case management team for assistance with care management and care coordination.  ? ?Follow Up Plan: A HIPAA compliant phone message was left for the patient providing contact information and requesting a return call.  ?The care management team will reach out to the patient again over the next 7 days.  ?If patient returns call to provider office, please advise to call [place]* at [phone].* ? ?[name]  ?Care Guide, Embedded Care Coordination ?[place]  Care Management  ?Direct Dial: [phone] ? ?

## 2021-08-05 NOTE — Telephone Encounter (Signed)
Called and spoke to pt concerning Prolia and Evenity. Pt was advised that Prolia out of pocket cost is estimated at $302 dollars every 6 months. Also advised pt estimated cost of Evenity would be $444 dollars a month. Pt stated that she can not afford Evenity but she would like to move forward with Prolia. Pt states that she will need to speak to her niece about bringing her in. She states if will be after [place] due to niece has plans until after. Pt states she will call back and advise when she can come in and make an appt. Pt was advised that I need a few days to order medication and get it to [facility]. Pt verbalized understanding.  ?

## 2021-08-07 NOTE — Chronic Care Management (AMB) (Signed)
?  Care Management  ? ?Note ? ?08/07/2021 ?Name: Tabitha Ramos MRN: [id number] DOB: [birth year] ? ?[name] is a 88 y.o. year old female who is a primary care patient of Rita Ohara, MD. I reached out to Verl Dicker by phone today offer care coordination services.  ? ?[name] was given information about care management services today including:  ?Care management services include personalized support from designated clinical staff supervised by her physician, including individualized plan of care and coordination with other care providers ?24/7 contact phone numbers for assistance for urgent and routine care needs. ?The patient may stop care management services at any time by phone call to the office staff. ? ?Patient agreed to services and verbal consent obtained.  ? ?Follow up plan: ?Telephone appointment with care management team member scheduled for:08/24/21 ? ?[name]  ?Care Guide, Embedded Care Coordination ?[place]  Care Management  ?Direct Dial: [phone] ? ?

## 2021-08-12 ENCOUNTER — Telehealth: Payer: Self-pay | Admitting: Family Medicine

## 2021-08-12 NOTE — Telephone Encounter (Signed)
Pt called back and stated that she has declined to take Prolia. She states that she wants to concentrate of other areas of her health. She also states that due to her age she is not interested in starting at this time. She states that maybe once other issues are improved she will reconsider.  ?

## 2021-08-24 ENCOUNTER — Ambulatory Visit: Payer: Medicare HMO

## 2021-08-24 DIAGNOSIS — R54 Age-related physical debility: Secondary | ICD-10-CM

## 2021-08-24 DIAGNOSIS — E46 Unspecified protein-calorie malnutrition: Secondary | ICD-10-CM

## 2021-08-24 DIAGNOSIS — R636 Underweight: Secondary | ICD-10-CM

## 2021-08-24 DIAGNOSIS — R7303 Prediabetes: Secondary | ICD-10-CM

## 2021-08-24 NOTE — Patient Instructions (Signed)
Visit Information ? ?Thank you for taking time to visit with me today. Please don't hesitate to contact me if I can be of assistance to you. ? ? ?Please call the care guide team at [phone] if you need to schedule an appointment with me. ? ?If you are experiencing a Mental Health or [place] or need someone to talk to, please call the Suicide and Crisis Lifeline: 988  ? ?Following is a copy of your full plan of care:  ?There are no care plans that you recently modified to display for this patient. ? ? ?Ms. Balla was given information about Care Management services by the embedded care coordination team including:  ?Care Management services include personalized support from designated clinical staff supervised by her physician, including individualized plan of care and coordination with other care providers ?24/7 contact phone numbers for assistance for urgent and routine care needs. ?The patient may stop CCM services at any time (effective at the end of the month) by phone call to the office staff. ? ?Patient agreed to services and verbal consent obtained.  ? ?The patient verbalized understanding of instructions, educational materials, and care plan provided today and declined offer to receive copy of patient instructions, educational materials, and care plan.  ? ?No SW follow up planned at this time. Please contact me as needed with future resource needs. ? ?Daneen Schick, BSW, CDP ?Social Worker, Certified Dementia Practitioner ?[facility] Management ?[phone] ? ?   ? ? ?  ?

## 2021-08-24 NOTE — Chronic Care Management (AMB) (Signed)
? Social Work Note ? ?08/24/2021 ?Name: Tabitha Ramos MRN: [id number] DOB: September 01, 1930 ? ?Tabitha Ramos is a 88 y.o. year old female who is a primary care patient of Tabitha Ohara, MD.  The Care Management team was consulted for assistance with chronic disease management and care coordination needs. ? ?Tabitha Ramos was given information about Care Management services today including:  ?Care Management services include personalized support from designated clinical staff supervised by her physician, including individualized plan of care and coordination with other care providers ?24/7 contact phone numbers for assistance for urgent and routine care needs. ?The patient may stop care management services at any time (effective at the end of the month) by phone call to the office staff. ? ?Patient agreed to services and consent obtained.  ? ?Engaged with patient by telephone for initial visit in response to provider referral for social work chronic care management and care coordination services. ? ?Assessment: Review of patient past medical history, allergies, medications, and health status, including review of pertinent consultant reports was performed as part of comprehensive evaluation and provision of care management/care coordination services.  ? ?Successful outbound call placed to the patient in response to a care coordination referral due to patient losing 16 pounds over the last year. Patient reports her son passed away in 2020-05-18 and she feels this may be the cause for her weight loss. The patients brother who is 52 lives with her. Patient reports she is independent in all care needs except for driving due to Glaucoma. Patients brother does assist with transportation. ? ?Determined the patient does not have difficulty affording meals. She reports she drinks Boost daily. Patients diet mainly consists of chicken, tuna, salmon, berries, vegetables, eggs, and salads. The patient does not weigh herself but feels she has  gained some weight based on the way her clothes are fitting. ? ?Patient is open to speaking with [place] to complete a clinical assessment. SW collaboration with Consulting civil engineer requesting she outreach the patient over the next month. ? ?SDOH (Social Determinants of Health) assessments and interventions performed:  Yes ?SDOH Interventions   ? ?Flowsheet Row Most Recent Value  ?SDOH Interventions   ?Food Insecurity Interventions Intervention Not Indicated  ?Financial Strain Interventions Intervention Not Indicated  ?Housing Interventions Intervention Not Indicated  ?Transportation Interventions Intervention Not Indicated  ? ?  ?  ? ?Advanced Directives Status: Not addressed in this encounter. ? ?Care Plan ? ?No Known Allergies ? ?Outpatient Encounter Medications as of 08/24/2021  ?Medication Sig Note  ? Ascorbic Acid (VITAMIN C) 1000 MG tablet Take 1,000 mg by mouth daily.   ? b complex vitamins capsule Take 1 capsule by mouth daily.   ? Calcium Carb-Cholecalciferol (CALCIUM 500 +D PO) Take 1 tablet by mouth daily.   ? Cholecalciferol 25 MCG (1000 UT) capsule Take by mouth.   ? dorzolamide-timolol (COSOPT) 22.3-6.8 MG/ML ophthalmic solution 1 drop 2 (two) times daily.   ? Menaquinone-7 (VITAMIN K2) 100 MCG CAPS Take 1 capsule by mouth daily.   ? Multiple Vitamin (MULTIVITAMIN) tablet Take 1 tablet by mouth daily.   ? Multiple Vitamins-Minerals (ZINC PO) Take 1 tablet by mouth daily. 07/10/2020: With calcium and mg  ? pilocarpine (PILOCAR) 2 % ophthalmic solution INSTILL 1 DROP IN BOTH EYES 4 TIMES A DAY   ? ROCKLATAN 0.02-0.005 % SOLN Apply 1 drop to eye at bedtime.   ? ?No facility-administered encounter medications on file as of 08/24/2021.  ? ? ?  Patient Active Problem List  ? Diagnosis Date Noted  ? Multinodular thyroid 07/12/2021  ? Mixed hyperlipidemia 07/12/2021  ? Osteoporosis without current pathological fracture 07/12/2021  ? ? ?Conditions to be addressed/monitored:  Prediabetes,  Osteoporosis, Mixed Hyperlipidemia, Protein Calorie Malnutrition ? ?There are no care plans that you recently modified to display for this patient. ?  ? ?Follow Up Plan:  No SW follow up planned at this time. The patient is encouraged to contact SW as needed with future resource needs. RN [place] will follow up with the patient to conduct a clinical assessment and assist with identified care coordination needs. ?     ?Tabitha Ramos, BSW, CDP ?Social Worker, Certified Dementia Practitioner ?[facility] Care Management ?[phone] ? ?   ? ? ?

## 2021-09-08 DIAGNOSIS — H401133 Primary open-angle glaucoma, bilateral, severe stage: Secondary | ICD-10-CM | POA: Diagnosis not present

## 2021-09-15 ENCOUNTER — Ambulatory Visit: Payer: Self-pay

## 2021-09-15 ENCOUNTER — Telehealth: Payer: Medicare HMO

## 2021-09-15 DIAGNOSIS — R54 Age-related physical debility: Secondary | ICD-10-CM

## 2021-09-15 DIAGNOSIS — R7303 Prediabetes: Secondary | ICD-10-CM

## 2021-09-15 DIAGNOSIS — R636 Underweight: Secondary | ICD-10-CM

## 2021-09-15 DIAGNOSIS — E46 Unspecified protein-calorie malnutrition: Secondary | ICD-10-CM

## 2021-09-15 NOTE — Chronic Care Management (AMB) (Signed)
? Care Management ?  ? RN Visit Note ? ?09/15/2021 ?Name: Tabitha Ramos MRN: [id number] DOB: 02/02/1931 ? ?Subjective: ?Tabitha Ramos is a 88 y.o. year old female who is a primary care patient of Rita Ohara, MD. The care management team was consulted for assistance with disease management and care coordination needs.   ? ?Engaged with patient by telephone for initial visit in response to provider referral for case management and/or care coordination services.  ? ?Consent to Services:  ? Tabitha Ramos was given information about Care Management services today including:  ?Care Management services includes personalized support from designated clinical staff supervised by her physician, including individualized plan of care and coordination with other care providers ?24/7 contact phone numbers for assistance for urgent and routine care needs. ?The patient may stop case management services at any time by phone call to the office staff. ? ?Patient agreed to services and consent obtained.  ? ?Assessment: Review of patient past medical history, allergies, medications, health status, including review of consultants reports, laboratory and other test data, was performed as part of comprehensive evaluation and provision of chronic care management services.  ? ?SDOH (Social Determinants of Health) assessments and interventions performed:   ? ?Care Plan ? ?No Known Allergies ? ?Outpatient Encounter Medications as of 09/15/2021  ?Medication Sig Note  ? dorzolamide-timolol (COSOPT) 22.3-6.8 MG/ML ophthalmic solution 1 drop 2 (two) times daily.   ? pilocarpine (PILOCAR) 2 % ophthalmic solution INSTILL 1 DROP IN BOTH EYES 4 TIMES A DAY   ? ROCKLATAN 0.02-0.005 % SOLN Apply 1 drop to eye at bedtime.   ? Ascorbic Acid (VITAMIN C) 1000 MG tablet Take 1,000 mg by mouth daily.   ? b complex vitamins capsule Take 1 capsule by mouth daily.   ? Calcium Carb-Cholecalciferol (CALCIUM 500 +D PO) Take 1 tablet by mouth daily.   ? Cholecalciferol 25  MCG (1000 UT) capsule Take by mouth.   ? Menaquinone-7 (VITAMIN K2) 100 MCG CAPS Take 1 capsule by mouth daily.   ? Multiple Vitamin (MULTIVITAMIN) tablet Take 1 tablet by mouth daily.   ? Multiple Vitamins-Minerals (ZINC PO) Take 1 tablet by mouth daily. 07/10/2020: With calcium and mg  ? ?No facility-administered encounter medications on file as of 09/15/2021.  ? ? ?Patient Active Problem List  ? Diagnosis Date Noted  ? Multinodular thyroid 07/12/2021  ? Mixed hyperlipidemia 07/12/2021  ? Osteoporosis without current pathological fracture 07/12/2021  ? ? ?Conditions to be addressed/monitored:  Prediabetes, Underweight, Protein-Caloric Malnutrition, Frailty ? ?Care Plan : RN Care Manager Plan of Care  ?Updates made by Lynne Logan, RN since 09/15/2021 12:00 AM  ?  ? ?Problem: No plan of care established for management of chronic disease states Prediabetes, Underweight, Protein Caloric-Malnutrition, Frailty   ?Priority: High  ?  ? ?Long-Range Goal: Establishment of plan of care for management of chronic disease states  Prediabetes, Underweight, Protein Caloric-Malnutrition, Frailty   ?Start Date: 09/15/2021  ?Expected End Date: 09/16/2022  ?This Visit's Progress: On track  ?Priority: High  ?Note:   ?    Current Barriers:  ?Knowledge Deficits related to plan of care for management of  Prediabetes, Underweight, Protein-Caloric Malnutrition, Frailty ?Chronic Disease Management support and education needs related to Prediabetes, Underweight, Protein-Caloric Malnutrition, Frailty ? ?RNCM Clinical Goal(s):  ?Patient will verbalize basic understanding of Prediabetes, Underweight, Protein- Caloric Malnutrition, Frailty disease process and self health management plan as evidenced by patient will increase her caloric intake and improve her BMI to  reach a healthy weight range  ?take all medications exactly as prescribed and will call provider for medication related questions as evidenced by patient will demonstrate improved  understanding of prescribed medications and rationale for usage as evidenced by patient teach back ?demonstrate Improved health management independence as evidenced by patient will report 100% adherence to her prescribed treatment plan as directed  ?continue to work with RN Care Manager to address care management and care coordination needs related to Prediabetes, Underweight, Protein-Caloric Malnutrition, Frailty as evidenced by adherence to CM Team Scheduled appointments ?demonstrate ongoing self health care management ability  as evidenced by  through collaboration with RN Care manager, provider, and care team.  ? ?Interventions: ?1:1 collaboration with primary care provider regarding development and update of comprehensive plan of care as evidenced by provider attestation and co-signature ?Inter-disciplinary care team collaboration (see longitudinal plan of care) ?Evaluation of current treatment plan related to  self management and patient's adherence to plan as established by provider ? ?Prediabetes Interventions:  (Status:  New goal.) Long Term Goal ?Assessed patient's understanding of A1c goal:  <5.7 % ?Review of patient status, including review of consultants reports, relevant laboratory and other test results, and medications completed ?Provided education to patient about basic Prediabetes disease process ?Counseled on importance of regular laboratory monitoring as prescribed ?Educated patient on dietary and exercise recommendations ?Mailed printed educational materials related to Diabetes management ?Lab Results  ?Component Value Date  ? HGBA1C 6.1 (H) 07/14/2021  ? ? ?Protein-caloric malnutrition, frailty: (Status:  New goal.)  Long Term Goal ?Evaluation of current treatment plan related to protein-caloric malnutrition,  self-management and patient's adherence to plan as established by provider ? Discussed and reviewed current weight; Ht '5\' 1"'$  (1.549 m), Wt 85 lb 6.4 oz (38.7 kg) ?BMI 16.14 kg/m?, BSA  1.29 m? ? Educated patient on BMI weight status category, educated on BMI for healthy weight ? Reviewed and discussed past encounter weight status;  ?Wt Readings from Last 3 Encounters:  ?07/13/21 85 lb 6.4 oz (38.7 kg)  ?07/10/20 101 lb 9.6 oz (46.1 kg)  ?Determined patient feels her weight loss is due to having a poor appetite, she is concerned this may be potentiated by her eye drops used for glaucoma ?Provided education to patient about basic disease process for protein-caloric malnutrition leading to frailty  ?Educated patient on the use of protein shakes while having prediabetes, instructed patient to switch her Boost to Glucerna and directed on usage and nutritional benefits ?Encouraged patient to consider purchasing a weight scale for home use, discussed having her use her OTC [employer] benefit if available  ?Educated patient on importance to eat 6 small high protein, low carb meals per day ?Mailed printed educational materials to patient related to Elderly Nutrition  ?Discussed plans with patient for ongoing care management follow up and provided patient with direct contact information for care management team ? ?Patient Goals/Self-Care Activities: ?Take all medications as prescribed ?Attend all scheduled provider appointments ?Call pharmacy for medication refills 3-7 days in advance of running out of medications ?Perform all self care activities independently  ?Perform IADL's (shopping, preparing meals, housekeeping, managing finances) independently ?Call provider office for new concerns or questions  ? ?Follow Up Plan:  Telephone follow up appointment with care management team member scheduled for:  10/27/21   ?  ? ?Barb Merino, RN, BSN, CCM ?Care Management Coordinator ?[facility]  ?Direct Phone: [phone] ? ? ? ? ? ? ?

## 2021-09-15 NOTE — Patient Instructions (Signed)
Visit Information  ? ?Thank you for taking time to visit with me today. Please don't hesitate to contact me if I can be of assistance to you before our next scheduled telephone appointment. ? ?Following are the goals we discussed today:  ?(Copy and paste patient goals from clinical care plan here) ? ?Our next appointment is by telephone on 10/27/21 at 11:20 AM ? ?Please call the care guide team at [phone] if you need to cancel or reschedule your appointment.  ? ?If you are experiencing a Mental Health or [place] or need someone to talk to, please call 1-800-273-TALK (toll free, 24 hour hotline)  ? ?Following is a copy of your full care plan:  ?Care Plan : Tabitha Ramos of Care  ?Updates made by Tabitha Logan, RN since 09/15/2021 12:00 AM  ?  ? ?Problem: No plan of care established for management of chronic disease states Prediabetes, Underweight, Protein Caloric-Malnutrition, Frailty   ?Priority: High  ?  ? ?Long-Range Goal: Establishment of plan of care for management of chronic disease states  Prediabetes, Underweight, Protein Caloric-Malnutrition, Frailty   ?Start Date: 09/15/2021  ?Expected End Date: 09/16/2022  ?This Visit's Progress: On track  ?Priority: High  ?Note:   ?    Current Barriers:  ?Knowledge Deficits related to plan of care for management of  Prediabetes, Underweight, Protein-Caloric Malnutrition, Frailty ?Chronic Disease Management support and education needs related to Prediabetes, Underweight, Protein-Caloric Malnutrition, Frailty ? ?RNCM Clinical Goal(s):  ?Patient will verbalize basic understanding of Prediabetes, Underweight, Protein- Caloric Malnutrition, Frailty disease process and self health management plan as evidenced by patient will increase her caloric intake and improve her BMI to reach a healthy weight range  ?take all medications exactly as prescribed and will call provider for medication related questions as evidenced by patient will demonstrate improved  understanding of prescribed medications and rationale for usage as evidenced by patient teach back ?demonstrate Improved health management independence as evidenced by patient will report 100% adherence to her prescribed treatment plan as directed  ?continue to work with RN Care Manager to address care management and care coordination needs related to Prediabetes, Underweight, Protein-Caloric Malnutrition, Frailty as evidenced by adherence to CM Team Scheduled appointments ?demonstrate ongoing self health care management ability  as evidenced by  through collaboration with RN Care manager, provider, and care team.  ? ?Interventions: ?1:1 collaboration with primary care provider regarding development and update of comprehensive plan of care as evidenced by provider attestation and co-signature ?Inter-disciplinary care team collaboration (see longitudinal plan of care) ?Evaluation of current treatment plan related to  self management and patient's adherence to plan as established by provider ? ?Prediabetes Interventions:  (Status:  New goal.) Long Term Goal ?Assessed patient's understanding of A1c goal:  <5.7 % ?Review of patient status, including review of consultants reports, relevant laboratory and other test results, and medications completed ?Provided education to patient about basic Prediabetes disease process ?Counseled on importance of regular laboratory monitoring as prescribed ?Educated patient on dietary and exercise recommendations ?Mailed printed educational materials related to Diabetes management ?Lab Results  ?Component Value Date  ? HGBA1C 6.1 (H) 07/14/2021  ? ? ?Protein-caloric malnutrition, frailty: (Status:  New goal.)  Long Term Goal ?Evaluation of current treatment plan related to protein-caloric malnutrition,  self-management and patient's adherence to plan as established by provider ? Discussed and reviewed current weight; Ht _0  (1.549 m), Wt 85 lb 6.4 oz (38.7 kg) ?BMI 16.14 kg/m?, BSA  1.29 m? ?  Educated patient on BMI weight status category, educated on BMI for healthy weight ? Reviewed and discussed past encounter weight status;  ?Wt Readings from Last 3 Encounters:  ?07/13/21 85 lb 6.4 oz (38.7 kg)  ?07/10/20 101 lb 9.6 oz (46.1 kg)  ?Determined patient feels her weight loss is due to having a poor appetite, she is concerned this may be potentiated by her eye drops used for glaucoma ?Provided education to patient about basic disease process for protein-caloric malnutrition leading to frailty  ?Educated patient on the use of protein shakes while having prediabetes, instructed patient to switch her Boost to Glucerna and directed on usage and nutritional benefits ?Encouraged patient to consider purchasing a weight scale for home use, discussed having her use her OTC [employer] benefit if available  ?Educated patient on importance to eat 6 small high protein, low carb meals per day ?Mailed printed educational materials to patient related to Elderly Nutrition  ?Discussed plans with patient for ongoing care management follow up and provided patient with direct contact information for care management team ? ?Patient Goals/Self-Care Activities: ?Take all medications as prescribed ?Attend all scheduled provider appointments ?Call pharmacy for medication refills 3-7 days in advance of running out of medications ?Perform all self care activities independently  ?Perform IADL's (shopping, preparing meals, housekeeping, managing finances) independently ?Call provider office for new concerns or questions  ? ?Follow Up Plan:  Telephone follow up appointment with care management team member scheduled for:  10/27/21   ?  ? ? ?Consent to CCM Services: ?Tabitha Ramos was given information about Chronic Care Management services including:  ?CCM service includes personalized support from designated clinical staff supervised by her physician, including individualized plan of care and coordination with other care  providers ?24/7 contact phone numbers for assistance for urgent and routine care needs. ?Service will only be billed when office clinical staff spend 20 minutes or more in a month to coordinate care. ?Only one practitioner may furnish and bill the service in a calendar month. ?The patient may stop CCM services at any time (effective at the end of the month) by phone call to the office staff. ?The patient will be responsible for cost sharing (co-pay) of up to 20% of the service fee (after annual deductible is met). ? ?Patient agreed to services and verbal consent obtained.  ? ?The patient verbalized understanding of instructions, educational materials, and care plan provided today and agreed to receive a mailed copy of patient instructions, educational materials, and care plan.  ? ?Telephone follow up appointment with care management team member scheduled for: 10/27/21 ? ? ?  ?

## 2021-10-19 ENCOUNTER — Encounter: Payer: Medicare HMO | Admitting: Family Medicine

## 2021-10-27 ENCOUNTER — Ambulatory Visit: Payer: Self-pay

## 2021-10-27 ENCOUNTER — Telehealth: Payer: Medicare HMO

## 2021-10-28 NOTE — Patient Instructions (Signed)
Visit Information  Thank you for taking time to visit with me today. Please don't hesitate to contact me if I can be of assistance to you before our next scheduled telephone appointment.  Following are the goals we discussed today:  Take all medications as prescribed Attend all scheduled provider appointments Call pharmacy for medication refills 3-7 days in advance of running out of medications Perform all self care activities independently  Call provider office for new concerns or questions  drink 6 to 8 glasses of water each day fill half of plate with vegetables manage portion size Continue to monitor your weight, notify Dr. Tomi Bamberger of persistent weight loss, change in appetite or new concerns   If you are experiencing a Mental Health or [place] or need someone to talk to, please call 1-800-273-TALK (toll free, 24 hour hotline)   The patient verbalized understanding of instructions, educational materials, and care plan provided today and agreed to receive a mailed copy of patient instructions, educational materials, and care plan.   Barb Merino, RN, BSN, CCM Care Management Coordinator [facility]  Direct Phone: [phone]

## 2021-10-28 NOTE — Chronic Care Management (AMB) (Signed)
Care Management    RN Visit Note  10/27/2021 Name: Tabitha Ramos MRN: [id number] DOB: June 30, 1930  Subjective: Tabitha Ramos is a 88 y.o. year old female who is a primary care patient of Rita Ohara, MD. The care management team was consulted for assistance with disease management and care coordination needs.    Engaged with patient by telephone for follow up visit in response to provider referral for case management and/or care coordination services.   Consent to Services:   [name] was given information about Care Management services today including:  Care Management services includes personalized support from designated clinical staff supervised by her physician, including individualized plan of care and coordination with other care providers 24/7 contact phone numbers for assistance for urgent and routine care needs. The patient may stop case management services at any time by phone call to the office staff.  Patient agreed to services and consent obtained.   Assessment: Review of patient past medical history, allergies, medications, health status, including review of consultants reports, laboratory and other test data, was performed as part of comprehensive evaluation and provision of chronic care management services.   SDOH (Social Determinants of Health) assessments and interventions performed:  yes, no acute needs  Care Plan  No Known Allergies  Outpatient Encounter Medications as of 10/27/2021  Medication Sig Note   dorzolamide-timolol (COSOPT) 22.3-6.8 MG/ML ophthalmic solution 1 drop 2 (two) times daily.    pilocarpine (PILOCAR) 2 % ophthalmic solution INSTILL 1 DROP IN BOTH EYES 4 TIMES A DAY    ROCKLATAN 0.02-0.005 % SOLN Apply 1 drop to eye at bedtime.    Ascorbic Acid (VITAMIN C) 1000 MG tablet Take 1,000 mg by mouth daily.    b complex vitamins capsule Take 1 capsule by mouth daily.    Calcium Carb-Cholecalciferol (CALCIUM 500 +D PO) Take 1 tablet by mouth daily.     Cholecalciferol 25 MCG (1000 UT) capsule Take by mouth.    Menaquinone-7 (VITAMIN K2) 100 MCG CAPS Take 1 capsule by mouth daily.    Multiple Vitamin (MULTIVITAMIN) tablet Take 1 tablet by mouth daily.    Multiple Vitamins-Minerals (ZINC PO) Take 1 tablet by mouth daily. 07/10/2020: With calcium and mg   No facility-administered encounter medications on file as of 10/27/2021.    Patient Active Problem List   Diagnosis Date Noted   Multinodular thyroid 07/12/2021   Mixed hyperlipidemia 07/12/2021   Osteoporosis without current pathological fracture 07/12/2021    Conditions to be addressed/monitored:  Prediabetes, Underweight, Protein Caloric-Malnutrition, Frailty  Care Plan : RN Care Manager Plan of Care  Updates made by Lynne Logan, RN since 10/27/2021 12:00 AM     Problem: No plan of care established for management of chronic disease states Prediabetes, Underweight, Protein Caloric-Malnutrition, Frailty   Priority: High     Long-Range Goal: Establishment of plan of care for management of chronic disease states  Prediabetes, Underweight, Protein Caloric-Malnutrition, Frailty   Start Date: 09/15/2021  Expected End Date: 09/16/2022  Recent Progress: On track  Priority: High  Note:       Current Barriers:  Knowledge Deficits related to plan of care for management of  Prediabetes, Underweight, Protein-Caloric Malnutrition, Frailty Chronic Disease Management support and education needs related to Prediabetes, Underweight, Protein-Caloric Malnutrition, Frailty  RNCM Clinical Goal(s):  Patient will verbalize basic understanding of Prediabetes, Underweight, Protein- Caloric Malnutrition, Frailty disease process and self health management plan as evidenced by patient will increase her caloric intake and improve  her BMI to reach a healthy weight range  take all medications exactly as prescribed and will call provider for medication related questions as evidenced by patient will demonstrate  improved understanding of prescribed medications and rationale for usage as evidenced by patient teach back demonstrate Improved health management independence as evidenced by patient will report 100% adherence to her prescribed treatment plan as directed  continue to work with RN Care Manager to address care management and care coordination needs related to Prediabetes, Underweight, Protein-Caloric Malnutrition, Frailty as evidenced by adherence to CM Team Scheduled appointments demonstrate ongoing self health care management ability  as evidenced by  through collaboration with RN Care manager, provider, and care team.   Interventions: 1:1 collaboration with primary care provider regarding development and update of comprehensive plan of care as evidenced by provider attestation and co-signature Inter-disciplinary care team collaboration (see longitudinal plan of care) Evaluation of current treatment plan related to  self management and patient's adherence to plan as established by provider  Prediabetes Interventions:  (Status:  Goal on track:  Yes.) Long Term Goal Assessed patient's understanding of A1c goal:  <5.7 % Review of patient status, including review of consultants reports, relevant laboratory and other test results, and medications completed Provided education to patient about basic Prediabetes disease process Counseled on importance of regular laboratory monitoring as prescribed Educated patient on dietary and exercise recommendations Mailed printed educational materials related to Diabetes management Lab Results  Component Value Date   HGBA1C 6.1 (H) 07/14/2021    Protein-caloric malnutrition, frailty: (Status:  New goal.)  Long Term Goal Evaluation of current treatment plan related to protein-caloric malnutrition,  self-management and patient's adherence to plan as established by provider  Discussed and reviewed current weight; Ht '5\' 1"'$  (1.549 m), Wt 85 lb 6.4 oz (38.7 kg) BMI  16.14 kg/m, BSA 1.29 m  Educated patient on BMI weight status category, educated on BMI for healthy weight  Reviewed and discussed past encounter weight status;  Wt Readings from Last 3 Encounters:  07/13/21 85 lb 6.4 oz (38.7 kg)  07/10/20 101 lb 9.6 oz (46.1 kg)  Confirmed patient received the printed educational materials to patient related to Elderly Nutrition Discussed patient is now drinking 1-2 cans of Glucerna daily and is eating 3 meals per day Discussed patient feels she is taking in more calories since starting Glucerna and feels stronger  Assessed for SDOH barriers, none identified at this time Instructed patient to continue to monitor her weight and to notify Dr. Tomi Bamberger of persistent weight loss, change in appetite or concerns   Discussed plans with patient for ongoing care management follow up and provided patient with direct contact information for care management team  Patient Goals/Self-Care Activities: Take all medications as prescribed Attend all scheduled provider appointments Call pharmacy for medication refills 3-7 days in advance of running out of medications Perform all self care activities independently  Call provider office for new concerns or questions  drink 6 to 8 glasses of water each day fill half of plate with vegetables manage portion size Continue to monitor your weight, notify Dr. Tomi Bamberger of persistent weight loss, change in appetite or new concerns   Follow Up Plan:  patient's chronic health conditions are stable, no further RN CM follow up needed at this time       Barb Merino, RN, BSN, CCM Care Management Coordinator [place]  Direct Phone: [phone]

## 2021-12-15 DIAGNOSIS — H401133 Primary open-angle glaucoma, bilateral, severe stage: Secondary | ICD-10-CM | POA: Diagnosis not present

## 2022-02-10 ENCOUNTER — Inpatient Hospital Stay (HOSPITAL_COMMUNITY)
Admission: EM | Admit: 2022-02-10 | Discharge: 2022-02-14 | DRG: 521 | Disposition: A | Discharge status: Home Health | Payer: Medicare HMO | Attending: Internal Medicine | Admitting: Internal Medicine

## 2022-02-10 ENCOUNTER — Encounter (HOSPITAL_COMMUNITY): Payer: Self-pay | Admitting: Emergency Medicine

## 2022-02-10 ENCOUNTER — Emergency Department (HOSPITAL_COMMUNITY): Payer: Medicare HMO

## 2022-02-10 ENCOUNTER — Other Ambulatory Visit: Payer: Self-pay

## 2022-02-10 DIAGNOSIS — Z90710 Acquired absence of both cervix and uterus: Secondary | ICD-10-CM | POA: Diagnosis not present

## 2022-02-10 DIAGNOSIS — W19XXXA Unspecified fall, initial encounter: Secondary | ICD-10-CM | POA: Diagnosis not present

## 2022-02-10 DIAGNOSIS — Y92008 Other place in unspecified non-institutional (private) residence as the place of occurrence of the external cause: Secondary | ICD-10-CM | POA: Diagnosis not present

## 2022-02-10 DIAGNOSIS — J449 Chronic obstructive pulmonary disease, unspecified: Secondary | ICD-10-CM | POA: Diagnosis not present

## 2022-02-10 DIAGNOSIS — Z6372 Alcoholism and drug addiction in family: Secondary | ICD-10-CM | POA: Diagnosis not present

## 2022-02-10 DIAGNOSIS — Z681 Body mass index (BMI) 19 or less, adult: Secondary | ICD-10-CM | POA: Diagnosis not present

## 2022-02-10 DIAGNOSIS — Z8052 Family history of malignant neoplasm of bladder: Secondary | ICD-10-CM

## 2022-02-10 DIAGNOSIS — S72009A Fracture of unspecified part of neck of unspecified femur, initial encounter for closed fracture: Secondary | ICD-10-CM | POA: Diagnosis not present

## 2022-02-10 DIAGNOSIS — W010XXA Fall on same level from slipping, tripping and stumbling without subsequent striking against object, initial encounter: Secondary | ICD-10-CM | POA: Diagnosis present

## 2022-02-10 DIAGNOSIS — M1990 Unspecified osteoarthritis, unspecified site: Secondary | ICD-10-CM | POA: Diagnosis not present

## 2022-02-10 DIAGNOSIS — H409 Unspecified glaucoma: Secondary | ICD-10-CM | POA: Diagnosis present

## 2022-02-10 DIAGNOSIS — M810 Age-related osteoporosis without current pathological fracture: Secondary | ICD-10-CM | POA: Diagnosis not present

## 2022-02-10 DIAGNOSIS — M25559 Pain in unspecified hip: Secondary | ICD-10-CM | POA: Diagnosis not present

## 2022-02-10 DIAGNOSIS — E43 Unspecified severe protein-calorie malnutrition: Secondary | ICD-10-CM | POA: Diagnosis not present

## 2022-02-10 DIAGNOSIS — Z833 Family history of diabetes mellitus: Secondary | ICD-10-CM

## 2022-02-10 DIAGNOSIS — E7800 Pure hypercholesterolemia, unspecified: Secondary | ICD-10-CM | POA: Diagnosis not present

## 2022-02-10 DIAGNOSIS — Z96641 Presence of right artificial hip joint: Secondary | ICD-10-CM | POA: Diagnosis not present

## 2022-02-10 DIAGNOSIS — Z87891 Personal history of nicotine dependence: Secondary | ICD-10-CM

## 2022-02-10 DIAGNOSIS — R9431 Abnormal electrocardiogram [ECG] [EKG]: Secondary | ICD-10-CM | POA: Diagnosis not present

## 2022-02-10 DIAGNOSIS — M25551 Pain in right hip: Secondary | ICD-10-CM | POA: Diagnosis not present

## 2022-02-10 DIAGNOSIS — E876 Hypokalemia: Secondary | ICD-10-CM | POA: Diagnosis not present

## 2022-02-10 DIAGNOSIS — Z01818 Encounter for other preprocedural examination: Secondary | ICD-10-CM | POA: Diagnosis not present

## 2022-02-10 DIAGNOSIS — Z743 Need for continuous supervision: Secondary | ICD-10-CM | POA: Diagnosis not present

## 2022-02-10 DIAGNOSIS — S72011A Unspecified intracapsular fracture of right femur, initial encounter for closed fracture: Secondary | ICD-10-CM | POA: Diagnosis not present

## 2022-02-10 DIAGNOSIS — R531 Weakness: Secondary | ICD-10-CM | POA: Diagnosis not present

## 2022-02-10 DIAGNOSIS — Z811 Family history of alcohol abuse and dependence: Secondary | ICD-10-CM

## 2022-02-10 DIAGNOSIS — Z9889 Other specified postprocedural states: Secondary | ICD-10-CM | POA: Diagnosis not present

## 2022-02-10 DIAGNOSIS — Z471 Aftercare following joint replacement surgery: Secondary | ICD-10-CM | POA: Diagnosis not present

## 2022-02-10 DIAGNOSIS — S72001A Fracture of unspecified part of neck of right femur, initial encounter for closed fracture: Secondary | ICD-10-CM | POA: Diagnosis not present

## 2022-02-10 LAB — CBC WITH DIFFERENTIAL/PLATELET
Abs Immature Granulocytes: 0.02 10*3/uL (ref 0.00–0.07)
Basophils Absolute: 0 10*3/uL (ref 0.0–0.1)
Basophils Relative: 1 %
Eosinophils Absolute: 0.1 10*3/uL (ref 0.0–0.5)
Eosinophils Relative: 1 %
HCT: 44.7 % (ref 36.0–46.0)
Hemoglobin: 14.5 g/dL (ref 12.0–15.0)
Immature Granulocytes: 0 %
Lymphocytes Relative: 18 %
Lymphs Abs: 1.4 10*3/uL (ref 0.7–4.0)
MCH: 29.5 pg (ref 26.0–34.0)
MCHC: 32.4 g/dL (ref 30.0–36.0)
MCV: 90.9 fL (ref 80.0–100.0)
Monocytes Absolute: 0.5 10*3/uL (ref 0.1–1.0)
Monocytes Relative: 6 %
Neutro Abs: 5.6 10*3/uL (ref 1.7–7.7)
Neutrophils Relative %: 74 %
Platelets: 288 10*3/uL (ref 150–400)
RBC: 4.92 MIL/uL (ref 3.87–5.11)
RDW: 13.5 % (ref 11.5–15.5)
WBC: 7.6 10*3/uL (ref 4.0–10.5)
nRBC: 0 % (ref 0.0–0.2)

## 2022-02-10 LAB — CBC
HCT: 44.4 % (ref 36.0–46.0)
Hemoglobin: 14 g/dL (ref 12.0–15.0)
MCH: 29.7 pg (ref 26.0–34.0)
MCHC: 31.5 g/dL (ref 30.0–36.0)
MCV: 94.1 fL (ref 80.0–100.0)
Platelets: 272 10*3/uL (ref 150–400)
RBC: 4.72 MIL/uL (ref 3.87–5.11)
RDW: 13.4 % (ref 11.5–15.5)
WBC: 8.1 10*3/uL (ref 4.0–10.5)
nRBC: 0 % (ref 0.0–0.2)

## 2022-02-10 LAB — BASIC METABOLIC PANEL
Anion gap: 8 (ref 5–15)
BUN: 22 mg/dL (ref 8–23)
CO2: 27 mmol/L (ref 22–32)
Calcium: 8.9 mg/dL (ref 8.9–10.3)
Chloride: 103 mmol/L (ref 98–111)
Creatinine, Ser: 0.56 mg/dL (ref 0.44–1.00)
GFR, Estimated: >60 mL/min (ref >60)
Glucose, Bld: 118 mg/dL — ABNORMAL HIGH (ref 70–99)
Potassium: 3.4 mmol/L — ABNORMAL LOW (ref 3.5–5.1)
Sodium: 138 mmol/L (ref 135–145)

## 2022-02-10 LAB — TYPE AND SCREEN
ABO/RH(D): O POS
Antibody Screen: NEGATIVE

## 2022-02-10 LAB — PROTIME-INR
INR: 1 (ref 0.8–1.2)
Prothrombin Time: 13.5 seconds (ref 11.4–15.2)

## 2022-02-10 LAB — ABO/RH: ABO/RH(D): O POS

## 2022-02-10 LAB — CREATININE, SERUM
Creatinine, Ser: 0.52 mg/dL (ref 0.44–1.00)
GFR, Estimated: >60 mL/min (ref >60)

## 2022-02-10 MED ORDER — MORPHINE SULFATE (PF) 2 MG/ML IV SOLN
2.0000 mg | INTRAVENOUS | Status: DC | PRN
  Administered 2022-02-10 – 2022-02-11 (×3): 2 mg via INTRAVENOUS
  Filled 2022-02-10 (×3): qty 1

## 2022-02-10 MED ORDER — ENOXAPARIN SODIUM 30 MG/0.3ML IJ SOSY
30.0000 mg | PREFILLED_SYRINGE | INTRAMUSCULAR | Status: DC
  Administered 2022-02-10: 30 mg via SUBCUTANEOUS
  Filled 2022-02-10: qty 0.3

## 2022-02-10 MED ORDER — ACETAMINOPHEN 325 MG PO TABS
650.0000 mg | ORAL_TABLET | Freq: Four times a day (QID) | ORAL | Status: DC | PRN
  Administered 2022-02-12: 650 mg via ORAL
  Filled 2022-02-10: qty 2

## 2022-02-10 MED ORDER — ONDANSETRON HCL 4 MG/2ML IJ SOLN
4.0000 mg | Freq: Once | INTRAMUSCULAR | Status: DC
  Filled 2022-02-10: qty 2

## 2022-02-10 MED ORDER — ACETAMINOPHEN 650 MG RE SUPP: 650.0000 mg | Freq: Four times a day (QID) | RECTAL | Status: DC | PRN

## 2022-02-10 MED ORDER — FENTANYL CITRATE PF 50 MCG/ML IJ SOSY: 50.0000 ug | PREFILLED_SYRINGE | INTRAMUSCULAR | Status: DC | PRN

## 2022-02-10 NOTE — ED Triage Notes (Signed)
Pt BIB [facility] with reports of fall and right hip pain. Pt reports she tripped and fell on Monday and the pain has gotten worse.

## 2022-02-10 NOTE — Consult Note (Signed)
Reason for Consult:R hip fracture Referring Physician: EDP  Tabitha Ramos is an 88 y.o. female.  HPI: s/p fall 10-14 days ago with R hip pain.  She's been able to get around with help from her brother so didn't think it was broken.  Pain didn't get better so she came to ED.  Denied other injuries.  Previously independent ambulator.  Past Medical History:  Diagnosis Date   Adenomatous colon polyp 07/2000   tubulovillous adenoma on colonoscopy (Dr. Lajoyce Corners); Colonoscopy 2006 Dr. Olevia Perches hyperplastic polyp only   Former smoker    quit 2017   Glaucoma    Hepatic hemangioma 2004   noted on CT 2001, 2004   Hiatal hernia    small, noted on CT (2004)   Hypercholesteremia    Impaired fasting glucose    Multiple thyroid nodules    [place] 11/2018, benign   Osteoporosis    [place] 2007-2010, 2013-2017 (stopped for oral surgery, never restarted); 03/2012 T-3.5 L fem neck, -2.9 spine    Past Surgical History:  Procedure Laterality Date   APPENDECTOMY     CATARACT EXTRACTION, BILATERAL Bilateral    in her 21's   TONSILLECTOMY     TOTAL ABDOMINAL HYSTERECTOMY W/ BILATERAL SALPINGOOPHORECTOMY  1942   fibroids    Family History  Problem Relation Age of Onset   Diabetes Son    Cirrhosis Son    Bladder Cancer Brother    Alcoholism Brother     Social History:  reports that she quit smoking about 8 years ago. Her smoking use included cigarettes. She has never used smokeless tobacco. She reports that she does not currently use alcohol. She reports that she does not use drugs.  Allergies: No Known Allergies  Medications: I have reviewed the patient's current medications.  Results for orders placed or performed during the hospital encounter of 02/10/22 (from the past 48 hour(s))  Basic metabolic panel     Status: Abnormal   Collection Time: 02/10/22  3:15 PM  Result Value Ref Range   Sodium 138 135 - 145 mmol/L   Potassium 3.4 (L) 3.5 - 5.1 mmol/L   Chloride 103 98 - 111 mmol/L   CO2 27 22 - 32  mmol/L   Glucose, Bld 118 (H) 70 - 99 mg/dL    Comment: Glucose reference range applies only to samples taken after fasting for at least 8 hours.   BUN 22 8 - 23 mg/dL   Creatinine, Ser 0.56 0.44 - 1.00 mg/dL   Calcium 8.9 8.9 - 10.3 mg/dL   GFR, Estimated >60 >60 mL/min    Comment: (NOTE) Calculated using the CKD-EPI Creatinine Equation (2021)    Anion gap 8 5 - 15    Comment: Performed at [facility], [place] [address] [place], [place] [address]  CBC with Differential     Status: None   Collection Time: 02/10/22  3:15 PM  Result Value Ref Range   WBC 7.6 4.0 - 10.5 K/uL   RBC 4.92 3.87 - 5.11 MIL/uL   Hemoglobin 14.5 12.0 - 15.0 g/dL   HCT 44.7 36.0 - 46.0 %   MCV 90.9 80.0 - 100.0 fL   MCH 29.5 26.0 - 34.0 pg   MCHC 32.4 30.0 - 36.0 g/dL   RDW 13.5 11.5 - 15.5 %   Platelets 288 150 - 400 K/uL   nRBC 0.0 0.0 - 0.2 %   Neutrophils Relative % 74 %   Neutro Abs 5.6 1.7 - 7.7 K/uL  Lymphocytes Relative 18 %   Lymphs Abs 1.4 0.7 - 4.0 K/uL   Monocytes Relative 6 %   Monocytes Absolute 0.5 0.1 - 1.0 K/uL   Eosinophils Relative 1 %   Eosinophils Absolute 0.1 0.0 - 0.5 K/uL   Basophils Relative 1 %   Basophils Absolute 0.0 0.0 - 0.1 K/uL   Immature Granulocytes 0 %   Abs Immature Granulocytes 0.02 0.00 - 0.07 K/uL    Comment: Performed at [facility], [place] [address], [place], [place] [address]  Protime-INR     Status: None   Collection Time: 02/10/22  3:15 PM  Result Value Ref Range   Prothrombin Time 13.5 11.4 - 15.2 seconds   INR 1.0 0.8 - 1.2    Comment: (NOTE) INR goal varies based on device and disease states. Performed at [facility], [place] [address] [place], [place] [address]   Type and screen [place]     Status: None   Collection Time: 02/10/22  3:15 PM  Result Value Ref Range   ABO/RH(D) O POS    Antibody Screen NEG    Sample Expiration      02/13/2022,2359 Performed at [facility], [place] [address] [place], [place] [address]     DG Chest Port 1 View  Result Date: 02/10/2022 CLINICAL DATA:  Preop for hip fracture. EXAM: PORTABLE CHEST 1 VIEW COMPARISON:  Chest x-ray 03/26/2013 FINDINGS: Lungs are hyperinflated, unchanged. There are atherosclerotic calcifications of the aorta. The heart size and mediastinal contours are within normal limits. Both lungs are clear. The visualized skeletal structures are unremarkable. IMPRESSION: 1. No active disease. 2. COPD. Electronically Signed   By: Ronney Asters M.D.   On: 02/10/2022 15:38   DG Hip Unilat W or Wo Pelvis 2-3 Views Right  Result Date: 02/10/2022 CLINICAL DATA:  [place], right hip pain EXAM: DG HIP (WITH OR WITHOUT PELVIS) 2-3V RIGHT COMPARISON:  None Available. FINDINGS: Frontal view of the pelvis as well as frontal and cross-table lateral views of the right hip are obtained. There is an impacted subcapital right femoral neck fracture with slight ventral angulation at the fracture site. No dislocation. The remainder of the bony pelvis is unremarkable. Left hip is normal. IMPRESSION: 1. Comminuted impacted subcapital right femoral neck fracture. Electronically Signed   By: Randa Ngo M.D.   On: 02/10/2022 15:14    Review of Systems  All other systems reviewed and are negative.  Blood pressure 119/63, pulse 72, temperature 97.8 F (36.6 C), resp. rate (!) 22, SpO2 96 %. Physical Exam HENT:     Head: Atraumatic.  Eyes:     Extraocular Movements: Extraocular movements intact.  Cardiovascular:     Pulses: Normal pulses.  Pulmonary:     Effort: Pulmonary effort is normal.  Musculoskeletal:     Comments: RLE slightly shortened compared LLE.  Pain with hip rotation.  NVID.  Skin:    General: Skin is warm and dry.  Neurological:     Mental Status: She is alert.  Psychiatric:        Mood and Affect: Mood normal.     Assessment/Plan: R femoral neck fracture Recommend hemiarthroplasty vs THA  to restore function and decrease pain Case discussed with Dr. Zachery Dakins, who will take to surgery tomorrow Please keep NPO after MN Appreciate hospitalist care  Rhae Hammock 02/10/2022, 5:16 PM

## 2022-02-10 NOTE — ED Provider Notes (Signed)
3:46 PM Care assumed from Dr. Maryan Rued.  At time of transfer of care, patient is awaiting admission for fall with right hip fracture.  Previous team spoke with Dr. Tamera Punt with orthopedics who requested medicine admission.     Shauni Henner, Gwenyth Allegra, MD 02/10/22 [phone]

## 2022-02-10 NOTE — ED Provider Notes (Signed)
[facility] DEPT Provider Note   CSN: [id number] Arrival date & time: 02/10/22  1419     History  Chief Complaint  Patient presents with   Hip Pain    Tabitha Ramos is a 88 y.o. female.  Patient is an 88 year old female with a history of thyroid nodules, osteoporosis, hypercholesterolemia who is presenting today with EMS after a fall.  Patient reports that she got up to go look out her front door when she thinks she must of stumbled over something but reports falling down to the ground on Monday.  Her brother helped her get up and put her back in bed but she has not been able to walk since.  She did not feel lightheaded, dizzy or have any chest pain or shortness of breath.  She reports landing on the carpet but since the fall she has had pain in her right leg and has been unable to stand or walk.  She denies any numbness or tingling in her foot.  Thing else feels normal.  She denies hitting her head or loss of consciousness.  She has no neck pain.  She denies taking anticoagulation.  The history is provided by the patient and medical records.  Hip Pain       Home Medications Prior to Admission medications   Medication Sig Start Date End Date Taking? Authorizing Provider  Ascorbic Acid (VITAMIN C) 1000 MG tablet Take 1,000 mg by mouth daily.    [provider]  b complex vitamins capsule Take 1 capsule by mouth daily.    [provider]  Calcium Carb-Cholecalciferol (CALCIUM 500 +D PO) Take 1 tablet by mouth daily.    [provider]  Cholecalciferol 25 MCG (1000 UT) capsule Take by mouth.    [provider]  dorzolamide-timolol (COSOPT) 22.3-6.8 MG/ML ophthalmic solution 1 drop 2 (two) times daily. 06/11/20   [provider]  Menaquinone-7 (VITAMIN K2) 100 MCG CAPS Take 1 capsule by mouth daily.    [provider]  Multiple Vitamin (MULTIVITAMIN) tablet Take 1 tablet by mouth daily.    [provider]  Multiple Vitamins-Minerals (ZINC PO) Take 1 tablet by mouth daily.    [provider]  pilocarpine (PILOCAR) 2 % ophthalmic solution INSTILL 1 DROP IN BOTH EYES 4 TIMES A DAY 06/25/20   [provider]  ROCKLATAN 0.02-0.005 % SOLN Apply 1 drop to eye at bedtime. 06/24/20   [provider]      Allergies    Patient has no known allergies.    Review of Systems   Review of Systems  Physical Exam Updated Vital Signs BP 124/76   Pulse 72   Temp 97.8 F (36.6 C)   Resp (!) 30   SpO2 93%  Physical Exam Vitals and nursing note reviewed.  Constitutional:      General: She is not in acute distress.    Appearance: She is well-developed.  HENT:     Head: Normocephalic and atraumatic.  Eyes:     Conjunctiva/sclera: Conjunctivae normal.     Pupils: Pupils are equal, round, and reactive to light.  Cardiovascular:     Rate and Rhythm: Normal rate and regular rhythm.     Heart sounds: No murmur heard. Pulmonary:     Effort: Pulmonary effort is normal. No respiratory distress.     Breath sounds: Normal breath sounds. No wheezing or rales.  Abdominal:     General: There is no distension.  Palpations: Abdomen is soft.     Tenderness: There is no abdominal tenderness. There is no guarding or rebound.  Musculoskeletal:        General: Tenderness present.     Cervical back: Normal range of motion and neck supple.     Right hip: Tenderness and bony tenderness present. Decreased range of motion.     Comments: Foot is slightly shortened  Skin:    General: Skin is warm and dry.     Findings: No erythema or rash.  Neurological:     Mental Status: She is alert and oriented to person, place, and time. Mental status is at baseline.  Psychiatric:        Mood and Affect: Mood normal.        Behavior: Behavior normal.     ED Results / Procedures / Treatments   Labs (all labs ordered are listed, but only abnormal results are displayed) Labs Reviewed   BASIC METABOLIC PANEL - Abnormal; Notable for the following components:      Result Value   Potassium 3.4 (*)    Glucose, Bld 118 (*)    All other components within normal limits  CBC WITH DIFFERENTIAL/PLATELET  PROTIME-INR  TYPE AND SCREEN    EKG None  Radiology DG Chest Port 1 View  Result Date: 02/10/2022 CLINICAL DATA:  Preop for hip fracture. EXAM: PORTABLE CHEST 1 VIEW COMPARISON:  Chest x-ray 03/26/2013 FINDINGS: Lungs are hyperinflated, unchanged. There are atherosclerotic calcifications of the aorta. The heart size and mediastinal contours are within normal limits. Both lungs are clear. The visualized skeletal structures are unremarkable. IMPRESSION: 1. No active disease. 2. COPD. Electronically Signed   By: Ronney Asters M.D.   On: 02/10/2022 15:38   DG Hip Unilat W or Wo Pelvis 2-3 Views Right  Result Date: 02/10/2022 CLINICAL DATA:  [place], right hip pain EXAM: DG HIP (WITH OR WITHOUT PELVIS) 2-3V RIGHT COMPARISON:  None Available. FINDINGS: Frontal view of the pelvis as well as frontal and cross-table lateral views of the right hip are obtained. There is an impacted subcapital right femoral neck fracture with slight ventral angulation at the fracture site. No dislocation. The remainder of the bony pelvis is unremarkable. Left hip is normal. IMPRESSION: 1. Comminuted impacted subcapital right femoral neck fracture. Electronically Signed   By: Randa Ngo M.D.   On: 02/10/2022 15:14    Procedures Procedures    Medications Ordered in ED Medications  fentaNYL (SUBLIMAZE) injection 50 mcg (has no administration in time range)  ondansetron (ZOFRAN) injection 4 mg (4 mg Intravenous Patient Refused/Not Given 02/10/22 1543)    ED Course/ Medical Decision Making/ A&P                           Medical Decision Making Amount and/or Complexity of Data Reviewed Radiology: ordered and independent interpretation performed. Decision-making details documented in ED Course.   Pt  with multiple medical problems and comorbidities and presenting today with a complaint that caries a high risk for morbidity and mortality.  Here from home after a fall and significant pain in her right hip.  Concern for pelvic fracture versus hip fracture.  Patient is not anticoagulated.  Seem to have tripped and not related to syncope.  Patient does have pain and some mild shortening of the right leg.  She does not take any anticoagulation.  No other evidence of injury today.  Vital signs are normal. I have independently  visualized and interpreted pt's images today.  Right hip shows evidence of a femoral neck fracture.  Hip fracture protocol order set initiated.  Patient is currently pain controlled. Spoke with Dr. Tamera Punt with orthopedics and he will consult on the patient.  Labs are pending and patient was checked out to Dr. Gustavus Messing.  She will require admission as she is going to need repair.           Final Clinical Impression(s) / ED Diagnoses Final diagnoses:  Fall, initial encounter  Closed displaced fracture of right femoral neck ([place])    Rx / DC Orders ED Discharge Orders     None         Blanchie Dessert, MD 02/10/22 1544

## 2022-02-10 NOTE — H&P (Signed)
History and Physical    Patient: Tabitha Ramos GMW:[id number] DOB: October 29, 1930 DOA: 02/10/2022 DOS: the patient was seen and examined on 02/10/2022 PCP: Rita Ohara, MD  Patient coming from: Home  Chief Complaint:  Chief Complaint  Patient presents with   Hip Pain   HPI: Tabitha Ramos is a 88 y.o. female with medical history significant of glaucoma, HLD presents after mechanical fall from home. Pt reports that late previous week prior to admit, pt was cleaning condo after water heater leak. Around this time, pt got up from sofa to walk to front door when she tripped on carpeted floor, causing R hip pain and inability to walk. Symptoms persisted, prompting ED visit  In ED, pt was found to have R femoral neck fx on imaging. Orthopedic Surgery consulted. Hospitalist consulted for consideration for admission.  Review of Systems: As mentioned in the history of present illness. All other systems reviewed and are negative. Past Medical History:  Diagnosis Date   Adenomatous colon polyp 07/2000   tubulovillous adenoma on colonoscopy (Dr. Lajoyce Corners); Colonoscopy 2006 Dr. Olevia Perches hyperplastic polyp only   Former smoker    quit 2017   Glaucoma    Hepatic hemangioma 2004   noted on CT 2001, 2004   Hiatal hernia    small, noted on CT (2004)   Hypercholesteremia    Impaired fasting glucose    Multiple thyroid nodules    [place] 11/2018, benign   Osteoporosis    [place] 2007-2010, 2013-2017 (stopped for oral surgery, never restarted); 03/2012 T-3.5 L fem neck, -2.9 spine   Past Surgical History:  Procedure Laterality Date   APPENDECTOMY     CATARACT EXTRACTION, BILATERAL Bilateral    in her 4's   TONSILLECTOMY     TOTAL ABDOMINAL HYSTERECTOMY W/ BILATERAL SALPINGOOPHORECTOMY  1942   fibroids   Social History:  reports that she quit smoking about 8 years ago. Her smoking use included cigarettes. She has never used smokeless tobacco. She reports that she does not currently use alcohol. She reports that  she does not use drugs.  No Known Allergies  Family History  Problem Relation Age of Onset   Diabetes Son    Cirrhosis Son    Bladder Cancer Brother    Alcoholism Brother     Prior to Admission medications   Medication Sig Start Date End Date Taking? Authorizing Provider  Ascorbic Acid (VITAMIN C) 1000 MG tablet Take 1,000 mg by mouth daily.    [provider]  b complex vitamins capsule Take 1 capsule by mouth daily.    [provider]  Calcium Carb-Cholecalciferol (CALCIUM 500 +D PO) Take 1 tablet by mouth daily.    [provider]  Cholecalciferol 25 MCG (1000 UT) capsule Take by mouth.    [provider]  dorzolamide-timolol (COSOPT) 22.3-6.8 MG/ML ophthalmic solution 1 drop 2 (two) times daily. 06/11/20   [provider]  Menaquinone-7 (VITAMIN K2) 100 MCG CAPS Take 1 capsule by mouth daily.    [provider]  Multiple Vitamin (MULTIVITAMIN) tablet Take 1 tablet by mouth daily.    [provider]  Multiple Vitamins-Minerals (ZINC PO) Take 1 tablet by mouth daily.    [provider]  pilocarpine (PILOCAR) 2 % ophthalmic solution INSTILL 1 DROP IN BOTH EYES 4 TIMES A DAY 06/25/20   [provider]  ROCKLATAN 0.02-0.005 % SOLN Apply 1 drop to eye at bedtime. 06/24/20   [provider]    Physical Exam: Vitals:  02/10/22 1445 02/10/22 1500 02/10/22 1515 02/10/22 1530  BP: 118/77 119/66 113/68 124/76  Pulse: 66 71 72 72  Resp: (!) 23 (!) 23 (!) 25 (!) 30  Temp:      SpO2: 95% 94% 96% 93%   General exam: Awake, laying in bed, in nad Respiratory system: Normal respiratory effort, no wheezing Cardiovascular system: regular rate, s1, s2 Gastrointestinal system: Soft, nondistended, positive BS Central nervous system: CN2-12 grossly intact, strength intact Extremities: Perfused, no clubbing Skin: Normal skin turgor, no notable skin lesions seen Psychiatry: Mood normal // no visual  hallucinations   Data Reviewed:  Labs reviewed: Na 138, K 3.4, Cr 0.56  Assessment and Plan: No notes have been filed under this hospital service. Service: Hospitalist R femoral neck fracture -s/p mechanical fall -Orthopedic Surgery consulted by EDP -Will keep NPO for now with basal IVF -EKG reviewed, sinus, nonischemic. No chest pain. CXR reviewed, clear. Pt on minimal O2 support. Electrolytes stable and vitals stable. At this time, benefits to surgery would outweight perioperative risks  Glaucoma -Would resume home meds once confirmed by pharmacy  Fall -Without syncope -Would f/u on PT eval  Hypokalemia -Will replace -Recheck bmet in AM    Advance Care Planning:   Code Status: Not on file Full  Consults: Orthopedic Surgery  Family Communication: Pt in room  Severity of Illness: The appropriate patient status for this patient is INPATIENT. Inpatient status is judged to be reasonable and necessary in order to provide the required intensity of service to ensure the patient's safety. The patient's presenting symptoms, physical exam findings, and initial radiographic and laboratory data in the context of their chronic comorbidities is felt to place them at high risk for further clinical deterioration. Furthermore, it is not anticipated that the patient will be medically stable for discharge from the hospital within 2 midnights of admission.   * I certify that at the point of admission it is my clinical judgment that the patient will require inpatient hospital care spanning beyond 2 midnights from the point of admission due to high intensity of service, high risk for further deterioration and high frequency of surveillance required.*  Author: Marylu Lund, MD 02/10/2022 4:14 PM  For on call review [email or web address].

## 2022-02-11 ENCOUNTER — Other Ambulatory Visit: Payer: Self-pay

## 2022-02-11 ENCOUNTER — Encounter (HOSPITAL_COMMUNITY): Payer: Self-pay | Admitting: Internal Medicine

## 2022-02-11 ENCOUNTER — Inpatient Hospital Stay (HOSPITAL_COMMUNITY): Payer: Medicare HMO | Admitting: Anesthesiology

## 2022-02-11 ENCOUNTER — Inpatient Hospital Stay (HOSPITAL_COMMUNITY): Payer: Medicare HMO

## 2022-02-11 ENCOUNTER — Encounter (HOSPITAL_COMMUNITY)
Admission: EM | Disposition: A | Discharge status: Home Health | Payer: Self-pay | Source: Home / Self Care | Attending: Internal Medicine

## 2022-02-11 DIAGNOSIS — S72001A Fracture of unspecified part of neck of right femur, initial encounter for closed fracture: Secondary | ICD-10-CM

## 2022-02-11 DIAGNOSIS — Z87891 Personal history of nicotine dependence: Secondary | ICD-10-CM | POA: Diagnosis not present

## 2022-02-11 DIAGNOSIS — W19XXXA Unspecified fall, initial encounter: Secondary | ICD-10-CM | POA: Diagnosis not present

## 2022-02-11 DIAGNOSIS — M1990 Unspecified osteoarthritis, unspecified site: Secondary | ICD-10-CM | POA: Diagnosis not present

## 2022-02-11 HISTORY — PX: TOTAL HIP ARTHROPLASTY: SHX124

## 2022-02-11 LAB — CBC
HCT: 42.7 % (ref 36.0–46.0)
Hemoglobin: 13.6 g/dL (ref 12.0–15.0)
MCH: 29.5 pg (ref 26.0–34.0)
MCHC: 31.9 g/dL (ref 30.0–36.0)
MCV: 92.6 fL (ref 80.0–100.0)
Platelets: 248 10*3/uL (ref 150–400)
RBC: 4.61 MIL/uL (ref 3.87–5.11)
RDW: 13.5 % (ref 11.5–15.5)
WBC: 6.7 10*3/uL (ref 4.0–10.5)
nRBC: 0 % (ref 0.0–0.2)

## 2022-02-11 LAB — COMPREHENSIVE METABOLIC PANEL
ALT: 17 U/L (ref 0–44)
AST: 18 U/L (ref 15–41)
Albumin: 3.1 g/dL — ABNORMAL LOW (ref 3.5–5.0)
Alkaline Phosphatase: 68 U/L (ref 38–126)
Anion gap: 13 (ref 5–15)
BUN: 25 mg/dL — ABNORMAL HIGH (ref 8–23)
CO2: 25 mmol/L (ref 22–32)
Calcium: 8.8 mg/dL — ABNORMAL LOW (ref 8.9–10.3)
Chloride: 105 mmol/L (ref 98–111)
Creatinine, Ser: 0.66 mg/dL (ref 0.44–1.00)
GFR, Estimated: >60 mL/min (ref >60)
Glucose, Bld: 92 mg/dL (ref 70–99)
Potassium: 3.5 mmol/L (ref 3.5–5.1)
Sodium: 143 mmol/L (ref 135–145)
Total Bilirubin: 1.2 mg/dL (ref 0.3–1.2)
Total Protein: 5.8 g/dL — ABNORMAL LOW (ref 6.5–8.1)

## 2022-02-11 LAB — SURGICAL PCR SCREEN
MRSA, PCR: NEGATIVE
Staphylococcus aureus: NEGATIVE

## 2022-02-11 SURGERY — ARTHROPLASTY, HIP, TOTAL,POSTERIOR APPROACH
Anesthesia: General | Site: Hip | Laterality: Right

## 2022-02-11 MED ORDER — MUPIROCIN 2 % EX OINT
1.0000 | TOPICAL_OINTMENT | Freq: Two times a day (BID) | CUTANEOUS | Status: DC
  Filled 2022-02-11: qty 22

## 2022-02-11 MED ORDER — BUPIVACAINE LIPOSOME 1.3 % IJ SUSP
INTRAMUSCULAR | Status: DC | PRN
  Administered 2022-02-11: 20 mL

## 2022-02-11 MED ORDER — OXYCODONE HCL 5 MG PO TABS
2.5000 mg | ORAL_TABLET | ORAL | Status: DC | PRN
  Administered 2022-02-12: 2.5 mg via ORAL
  Administered 2022-02-12 – 2022-02-14 (×5): 5 mg via ORAL
  Filled 2022-02-11 (×6): qty 1

## 2022-02-11 MED ORDER — BUPIVACAINE LIPOSOME 1.3 % IJ SUSP
INTRAMUSCULAR | Status: AC
  Filled 2022-02-11: qty 20

## 2022-02-11 MED ORDER — LACTATED RINGERS IV SOLN: INTRAVENOUS | Status: DC

## 2022-02-11 MED ORDER — OYSTER SHELL CALCIUM/D3 500-5 MG-MCG PO TABS
1.0000 | ORAL_TABLET | Freq: Every day | ORAL | Status: DC
  Administered 2022-02-12 – 2022-02-14 (×3): 1 via ORAL
  Filled 2022-02-11 (×3): qty 1

## 2022-02-11 MED ORDER — B COMPLEX-C PO TABS
1.0000 | ORAL_TABLET | Freq: Every day | ORAL | Status: DC
  Administered 2022-02-12 – 2022-02-14 (×3): 1 via ORAL
  Filled 2022-02-11 (×3): qty 1

## 2022-02-11 MED ORDER — PROPOFOL 10 MG/ML IV BOLUS
INTRAVENOUS | Status: AC
  Filled 2022-02-11: qty 20

## 2022-02-11 MED ORDER — PHENYLEPHRINE 80 MCG/ML (10ML) SYRINGE FOR IV PUSH (FOR BLOOD PRESSURE SUPPORT)
PREFILLED_SYRINGE | INTRAVENOUS | Status: DC | PRN
  Administered 2022-02-11 (×2): 160 ug via INTRAVENOUS

## 2022-02-11 MED ORDER — CHLORHEXIDINE GLUCONATE 4 % EX LIQD: 60.0000 mL | Freq: Once | CUTANEOUS | Status: DC

## 2022-02-11 MED ORDER — PILOCARPINE HCL 2 % OP SOLN
1.0000 [drp] | Freq: Four times a day (QID) | OPHTHALMIC | Status: DC
  Administered 2022-02-13: 1 [drp] via OPHTHALMIC
  Filled 2022-02-11: qty 15

## 2022-02-11 MED ORDER — FENTANYL CITRATE PF 50 MCG/ML IJ SOSY
PREFILLED_SYRINGE | INTRAMUSCULAR | Status: AC
  Administered 2022-02-11: 50 ug via INTRAVENOUS
  Filled 2022-02-11: qty 1

## 2022-02-11 MED ORDER — SODIUM CHLORIDE (PF) 0.9 % IJ SOLN
INTRAMUSCULAR | Status: AC
  Filled 2022-02-11: qty 10

## 2022-02-11 MED ORDER — ISOPROPYL ALCOHOL 70 % SOLN
Status: DC | PRN
  Administered 2022-02-11: 1 via TOPICAL

## 2022-02-11 MED ORDER — ROCURONIUM BROMIDE 10 MG/ML (PF) SYRINGE
PREFILLED_SYRINGE | INTRAVENOUS | Status: DC | PRN
  Administered 2022-02-11: 30 mg via INTRAVENOUS

## 2022-02-11 MED ORDER — SODIUM CHLORIDE (PF) 0.9 % IJ SOLN
INTRAMUSCULAR | Status: DC | PRN
  Administered 2022-02-11: 20 mL via INTRAVENOUS

## 2022-02-11 MED ORDER — POVIDONE-IODINE 10 % EX SWAB
2.0000 | Freq: Once | CUTANEOUS | Status: AC
  Administered 2022-02-11: 2 via TOPICAL

## 2022-02-11 MED ORDER — EPHEDRINE 5 MG/ML INJ
INTRAVENOUS | Status: AC
  Filled 2022-02-11: qty 5

## 2022-02-11 MED ORDER — SODIUM CHLORIDE 0.9 % IR SOLN
Status: DC | PRN
  Administered 2022-02-11: 1000 mL

## 2022-02-11 MED ORDER — CHLORHEXIDINE GLUCONATE 0.12 % MT SOLN
15.0000 mL | Freq: Once | OROMUCOSAL | Status: AC
  Administered 2022-02-11: 15 mL via OROMUCOSAL

## 2022-02-11 MED ORDER — SODIUM CHLORIDE (PF) 0.9 % IJ SOLN
INTRAMUSCULAR | Status: AC
  Filled 2022-02-11: qty 50

## 2022-02-11 MED ORDER — DEXAMETHASONE SODIUM PHOSPHATE 10 MG/ML IJ SOLN
INTRAMUSCULAR | Status: DC | PRN
  Administered 2022-02-11: 5 mg via INTRAVENOUS

## 2022-02-11 MED ORDER — LIDOCAINE 2% (20 MG/ML) 5 ML SYRINGE
INTRAMUSCULAR | Status: DC | PRN
  Administered 2022-02-11: 40 mg via INTRAVENOUS

## 2022-02-11 MED ORDER — TRANEXAMIC ACID-NACL 1000-0.7 MG/100ML-% IV SOLN
1000.0000 mg | INTRAVENOUS | Status: AC
  Administered 2022-02-11: 1000 mg via INTRAVENOUS
  Filled 2022-02-11: qty 100

## 2022-02-11 MED ORDER — NETARSUDIL-LATANOPROST 0.02-0.005 % OP SOLN: 1.0000 [drp] | Freq: Every day | OPHTHALMIC | Status: DC

## 2022-02-11 MED ORDER — ONDANSETRON HCL 4 MG/2ML IJ SOLN
INTRAMUSCULAR | Status: DC | PRN
  Administered 2022-02-11: 4 mg via INTRAVENOUS

## 2022-02-11 MED ORDER — ONDANSETRON HCL 4 MG/2ML IJ SOLN
INTRAMUSCULAR | Status: AC
  Filled 2022-02-11: qty 2

## 2022-02-11 MED ORDER — SODIUM CHLORIDE 0.9 % IV SOLN: INTRAVENOUS | Status: DC

## 2022-02-11 MED ORDER — FENTANYL CITRATE PF 50 MCG/ML IJ SOSY
25.0000 ug | PREFILLED_SYRINGE | INTRAMUSCULAR | Status: DC | PRN
  Administered 2022-02-11: 50 ug via INTRAVENOUS

## 2022-02-11 MED ORDER — DEXAMETHASONE SODIUM PHOSPHATE 10 MG/ML IJ SOLN
INTRAMUSCULAR | Status: AC
  Filled 2022-02-11: qty 1

## 2022-02-11 MED ORDER — FENTANYL CITRATE (PF) 250 MCG/5ML IJ SOLN
INTRAMUSCULAR | Status: DC | PRN
  Administered 2022-02-11 (×2): 25 ug via INTRAVENOUS

## 2022-02-11 MED ORDER — PROPOFOL 10 MG/ML IV BOLUS
INTRAVENOUS | Status: DC | PRN
  Administered 2022-02-11: 70 mg via INTRAVENOUS

## 2022-02-11 MED ORDER — ALBUMIN HUMAN 5 % IV SOLN: INTRAVENOUS | Status: DC | PRN

## 2022-02-11 MED ORDER — FENTANYL CITRATE (PF) 100 MCG/2ML IJ SOLN
INTRAMUSCULAR | Status: AC
  Filled 2022-02-11: qty 2

## 2022-02-11 MED ORDER — EPHEDRINE SULFATE-NACL 50-0.9 MG/10ML-% IV SOSY
PREFILLED_SYRINGE | INTRAVENOUS | Status: DC | PRN
  Administered 2022-02-11 (×2): 10 mg via INTRAVENOUS

## 2022-02-11 MED ORDER — SUGAMMADEX SODIUM 200 MG/2ML IV SOLN
INTRAVENOUS | Status: DC | PRN
  Administered 2022-02-11: 100 mg via INTRAVENOUS

## 2022-02-11 MED ORDER — VITAMIN C 500 MG PO TABS
1000.0000 mg | ORAL_TABLET | Freq: Every day | ORAL | Status: DC
  Administered 2022-02-12 – 2022-02-14 (×3): 1000 mg via ORAL
  Filled 2022-02-11 (×3): qty 2

## 2022-02-11 MED ORDER — CEFAZOLIN SODIUM-DEXTROSE 2-4 GM/100ML-% IV SOLN
2.0000 g | INTRAVENOUS | Status: AC
  Administered 2022-02-11: 2 g via INTRAVENOUS
  Filled 2022-02-11: qty 100

## 2022-02-11 MED ORDER — PHENYLEPHRINE 80 MCG/ML (10ML) SYRINGE FOR IV PUSH (FOR BLOOD PRESSURE SUPPORT)
PREFILLED_SYRINGE | INTRAVENOUS | Status: AC
  Filled 2022-02-11: qty 10

## 2022-02-11 MED ORDER — ALBUMIN HUMAN 5 % IV SOLN
INTRAVENOUS | Status: AC
  Filled 2022-02-11: qty 250

## 2022-02-11 MED ORDER — CALCIUM CARB-CHOLECALCIFEROL 500-10 MG-MCG PO TABS: ORAL_TABLET | Freq: Every day | ORAL | Status: DC

## 2022-02-11 MED ORDER — B COMPLEX VITAMINS PO CAPS: 1.0000 | ORAL_CAPSULE | Freq: Every day | ORAL | Status: DC

## 2022-02-11 MED ORDER — ADULT MULTIVITAMIN W/MINERALS CH
1.0000 | ORAL_TABLET | Freq: Every day | ORAL | Status: DC
  Administered 2022-02-12 – 2022-02-14 (×3): 1 via ORAL
  Filled 2022-02-11 (×3): qty 1

## 2022-02-11 MED ORDER — WATER FOR IRRIGATION, STERILE IR SOLN
Status: DC | PRN
  Administered 2022-02-11: 2000 mL

## 2022-02-11 MED ORDER — CEFAZOLIN SODIUM-DEXTROSE 2-4 GM/100ML-% IV SOLN
2.0000 g | Freq: Three times a day (TID) | INTRAVENOUS | Status: AC
  Administered 2022-02-11 – 2022-02-12 (×2): 2 g via INTRAVENOUS
  Filled 2022-02-11 (×2): qty 100

## 2022-02-11 MED ORDER — ASPIRIN 81 MG PO TBEC
81.0000 mg | DELAYED_RELEASE_TABLET | Freq: Two times a day (BID) | ORAL | Status: DC
  Administered 2022-02-12 – 2022-02-14 (×5): 81 mg via ORAL
  Filled 2022-02-11 (×5): qty 1

## 2022-02-11 MED ORDER — FENTANYL CITRATE PF 50 MCG/ML IJ SOSY
PREFILLED_SYRINGE | INTRAMUSCULAR | Status: AC
  Filled 2022-02-11: qty 1

## 2022-02-11 MED ORDER — DORZOLAMIDE HCL-TIMOLOL MAL 2-0.5 % OP SOLN
1.0000 [drp] | Freq: Two times a day (BID) | OPHTHALMIC | Status: DC
  Administered 2022-02-11 – 2022-02-13 (×3): 1 [drp] via OPHTHALMIC
  Filled 2022-02-11: qty 10

## 2022-02-11 MED ORDER — 0.9 % SODIUM CHLORIDE (POUR BTL) OPTIME
TOPICAL | Status: DC | PRN
  Administered 2022-02-11: 1000 mL

## 2022-02-11 SURGICAL SUPPLY — 63 items
BAG COUNTER SPONGE SURGICOUNT (BAG) IMPLANT
BAG DECANTER FOR FLEXI CONT (MISCELLANEOUS) ×1 IMPLANT
BAG ZIPLOCK 12X15 (MISCELLANEOUS) ×1 IMPLANT
BLADE SAW SAG 25X90X1.19 (BLADE) ×1 IMPLANT
BRUSH FEMORAL CANAL (MISCELLANEOUS) IMPLANT
CEMENT BONE SIMPLEX SPEEDSET (Cement) IMPLANT
CHLORAPREP W/TINT 26 (MISCELLANEOUS) ×2 IMPLANT
COVER SURGICAL LIGHT HANDLE (MISCELLANEOUS) ×1 IMPLANT
DERMABOND ADVANCED .7 DNX12 (GAUZE/BANDAGES/DRESSINGS) ×1 IMPLANT
DERMABOND ADVANCED .7 DNX6 (GAUZE/BANDAGES/DRESSINGS) IMPLANT
DRAPE HIP W/POCKET STRL (MISCELLANEOUS) ×1 IMPLANT
DRAPE INCISE IOBAN 66X45 STRL (DRAPES) ×1 IMPLANT
DRAPE INCISE IOBAN 85X60 (DRAPES) ×1 IMPLANT
DRAPE POUCH INSTRU U-SHP 10X18 (DRAPES) ×1 IMPLANT
DRAPE SHEET LG 3/4 BI-LAMINATE (DRAPES) ×3 IMPLANT
DRAPE SURG 17X11 SM STRL (DRAPES) ×1 IMPLANT
DRAPE U-SHAPE 47X51 STRL (DRAPES) ×2 IMPLANT
DRESSING AQUACEL AG SP 3.5X10 (GAUZE/BANDAGES/DRESSINGS) ×1 IMPLANT
DRSG AQUACEL AG ADV 3.5X10 (GAUZE/BANDAGES/DRESSINGS) IMPLANT
DRSG AQUACEL AG SP 3.5X10 (GAUZE/BANDAGES/DRESSINGS) ×1
ELECT BLADE TIP CTD 4 INCH (ELECTRODE) ×1 IMPLANT
ELECT REM PT RETURN 15FT ADLT (MISCELLANEOUS) ×1 IMPLANT
FEMORAL HEAD LFIT V40 28MM PL0 (Orthopedic Implant) IMPLANT
GLOVE BIOGEL PI IND STRL 8 (GLOVE) ×1 IMPLANT
GLOVE SURG ORTHO 8.0 STRL STRW (GLOVE) ×2 IMPLANT
GOWN STRL REUS W/ TWL XL LVL3 (GOWN DISPOSABLE) ×1 IMPLANT
GOWN STRL REUS W/TWL XL LVL3 (GOWN DISPOSABLE) ×1
HANDPIECE INTERPULSE COAX TIP (DISPOSABLE)
HEAD BP UNV 44X28XHIP FEM (Orthopedic Implant) IMPLANT
HEAD OSTEO BIPOLAR (Orthopedic Implant) ×1 IMPLANT
HOLDER FOLEY CATH W/STRAP (MISCELLANEOUS) ×1 IMPLANT
HOOD PEEL AWAY FLYTE STAYCOOL (MISCELLANEOUS) ×3 IMPLANT
KIT BASIN OR (CUSTOM PROCEDURE TRAY) ×1 IMPLANT
KIT TURNOVER KIT A (KITS) IMPLANT
MANIFOLD NEPTUNE II (INSTRUMENTS) ×1 IMPLANT
MARKER SKIN DUAL TIP RULER LAB (MISCELLANEOUS) ×1 IMPLANT
NEEDLE HYPO 22GX1.5 SAFETY (NEEDLE) IMPLANT
NS IRRIG 1000ML POUR BTL (IV SOLUTION) ×1 IMPLANT
PACK TOTAL JOINT (CUSTOM PROCEDURE TRAY) ×1 IMPLANT
PRESSURIZER FEMORAL UNIV (MISCELLANEOUS) IMPLANT
PROTECTOR NERVE ULNAR (MISCELLANEOUS) ×1 IMPLANT
RETRIEVER SUT HEWSON (MISCELLANEOUS) ×1 IMPLANT
SEALER BIPOLAR AQUA 6.0 (INSTRUMENTS) ×1 IMPLANT
SET HNDPC FAN SPRY TIP SCT (DISPOSABLE) IMPLANT
SPIKE FLUID TRANSFER (MISCELLANEOUS) ×3 IMPLANT
STEM HIP ACCOLADE SZ5 37X145 (Stem) IMPLANT
SUCTION FRAZIER HANDLE 12FR (TUBING) ×1
SUCTION TUBE FRAZIER 12FR DISP (TUBING) ×1 IMPLANT
SUT BONE WAX W31G (SUTURE) ×1 IMPLANT
SUT ETHIBOND #5 BRAIDED 30INL (SUTURE) ×1 IMPLANT
SUT MNCRL AB 3-0 PS2 18 (SUTURE) ×1 IMPLANT
SUT STRATAFIX 0 PDS 27 VIOLET (SUTURE) ×1
SUT STRATAFIX PDO 1 14 VIOLET (SUTURE) ×1
SUT STRATFX PDO 1 14 VIOLET (SUTURE) ×1
SUT VIC AB 2-0 CT2 27 (SUTURE) ×2 IMPLANT
SUTURE STRATFX 0 PDS 27 VIOLET (SUTURE) ×1 IMPLANT
SUTURE STRATFX PDO 1 14 VIOLET (SUTURE) ×1 IMPLANT
SYR 20ML LL LF (SYRINGE) ×2 IMPLANT
TOWEL OR 17X26 10 PK STRL BLUE (TOWEL DISPOSABLE) ×1 IMPLANT
TRAY FOLEY MTR SLVR 16FR STAT (SET/KITS/TRAYS/PACK) ×1 IMPLANT
TUBE SUCTION HIGH CAP CLEAR NV (SUCTIONS) IMPLANT
UNDERPAD 30X36 HEAVY ABSORB (UNDERPADS AND DIAPERS) ×1 IMPLANT
WATER STERILE IRR 1000ML POUR (IV SOLUTION) ×2 IMPLANT

## 2022-02-11 NOTE — Anesthesia Procedure Notes (Signed)
Procedure Name: Intubation Date/Time: 02/11/2022 4:07 PM  Performed by: Talbot Grumbling, CRNAPre-anesthesia Checklist: Patient identified, Emergency Drugs available, Suction available and Patient being monitored Patient Re-evaluated:Patient Re-evaluated prior to induction Oxygen Delivery Method: Circle system utilized Preoxygenation: Pre-oxygenation with 100% oxygen Induction Type: IV induction Ventilation: Mask ventilation without difficulty Laryngoscope Size: Mac and 3 Grade View: Grade I Tube type: Oral Tube size: 7.0 mm Number of attempts: 1 Airway Equipment and Method: Stylet Placement Confirmation: ETT inserted through vocal cords under direct vision, positive ETCO2 and breath sounds checked- equal and bilateral Secured at: 21 cm Tube secured with: Tape Dental Injury: Teeth and Oropharynx as per pre-operative assessment

## 2022-02-11 NOTE — Transfer of Care (Signed)
Immediate Anesthesia Transfer of Care Note  Patient: Tabitha Ramos  Procedure(s) Performed: HEMI HIP ARTHROPLASTY (Right: Hip)  Patient Location: PACU  Anesthesia Type:General  Level of Consciousness: sedated  Airway & Oxygen Therapy: Patient Spontanous Breathing and Patient connected to face mask oxygen  Post-op Assessment: Report given to RN and Post -op Vital signs reviewed and stable  Post vital signs: Reviewed and stable  Last Vitals:  Vitals Value Taken Time  BP    Temp    Pulse    Resp 24 02/11/22 1756  SpO2    Vitals shown include unvalidated device data.  Last Pain:  Vitals:   02/11/22 1430  TempSrc: Oral  PainSc:       Patients Stated Pain Goal: 2 (13/08/65 7846)  Complications: No notable events documented.

## 2022-02-11 NOTE — Anesthesia Postprocedure Evaluation (Signed)
Anesthesia Post Note  Patient: Tabitha Ramos  Procedure(s) Performed: HEMI HIP ARTHROPLASTY (Right: Hip)     Patient location during evaluation: PACU Anesthesia Type: General Level of consciousness: awake and alert Pain management: pain level controlled Vital Signs Assessment: post-procedure vital signs reviewed and stable Respiratory status: spontaneous breathing, nonlabored ventilation, respiratory function stable and patient connected to nasal cannula oxygen Cardiovascular status: blood pressure returned to baseline and stable Postop Assessment: no apparent nausea or vomiting Anesthetic complications: no   No notable events documented.  Last Vitals:  Vitals:   02/11/22 1815 02/11/22 1830  BP: 114/78 (!) 105/50  Pulse: 77 (!) 51  Resp: 13 13  Temp:    SpO2: 100% 100%    Last Pain:  Vitals:   02/11/22 1830  TempSrc:   PainSc: Asleep                 Kamaree Wheatley S

## 2022-02-11 NOTE — Discharge Instructions (Signed)

## 2022-02-11 NOTE — Hospital Course (Signed)
88 y.o. female with medical history significant of glaucoma, HLD presents after mechanical fall from home. Pt reports that late previous week prior to admit, pt was cleaning condo after water heater leak. Around this time, pt got up from sofa to walk to front door when she tripped on carpeted floor, causing R hip pain and inability to walk. Symptoms persisted, prompting ED visit   In ED, pt was found to have R femoral neck fx on imaging. Orthopedic Surgery consulted. Hospitalist consulted for consideration for admission.

## 2022-02-11 NOTE — Progress Notes (Signed)
Initial Nutrition Assessment  DOCUMENTATION CODES:   Severe malnutrition in context of social or environmental circumstances  INTERVENTION:  -When diet may be advanced, recommend dysphagia 3 diet-no teeth, dentures do not fit -Allow pt to choose menu with assist. -When diet may be advanced, recommend Ensure Plus HP BID to provide 350kcal and 20g protein -Encourage small, frequent meals, snacks and supplements  NUTRITION DIAGNOSIS:  Severe Malnutrition related to social / environmental circumstances as evidenced by energy intake < or equal to 50% for > or equal to 1 month, percent weight loss, severe fat depletion, severe muscle depletion.  GOAL:  Patient will meet greater than or equal to 90% of their needs  MONITOR:  PO intake, Supplement acceptance, Weight trends  REASON FOR ASSESSMENT:  Malnutrition Screening Tool    ASSESSMENT:  Pt is an 88yo F with PMH of glaucoma, osteoporosis, hiatal hernia and HLD who presents with R femoral neck fracture after mechanical fall at home. She is NPO at this time for scheduled THA at 14:10.  Visited pt at bedside with family present. Family reports pt had a significant 29.6% (at least 30#) unintended weight loss in the last year. They report this all started when pt had elevated BG on labs and cut out all sugar. FBG 92 this AM. Family reports pt will go all day without eating. She has Glucerna at home but doesn't often drink it. Reported po intake <50% estimated nutrient needs for >1 month. Pt had all of her teeth pulled at the start of covid. She has dentures but they are too big. When diet may be advanced post op, recommend dysphagia 3 diet (diet may need to be further downgraded depending on tolerance). Pt shows physical signs of malnutrition with severe muscle wasting and severe fat loss. She meets ASPEN criteria for severe protein calorie malnutrition r/t behavioral/environmental circumstances.  Discussed with pt the importance of adequate  nutrition for healing. Pt says she really likes fruits and vegetables. Discussed that fruits and veggies are important but we also need to find sources of protein and provided examples. Accepted foods available during admission: salmon, tuna, greek yogurt, cheese, peanut butter, mac and cheese, eggs and pinto beans. Encouraged 3 regular meals, w/ snacks or supplements. Recommend Ensure Plus HP BID (350kcal, 20g protein/bottle) to provide more calories and protein than Glucerna. Pt agreeable.   Medications reviewed and include: zofran, NS, prn morphine  Labs reviewed: BUN:25, corrected Ca:9.1, Albumin:3.1  NUTRITION - FOCUSED PHYSICAL EXAM:  Flowsheet Row Most Recent Value  Orbital Region Severe depletion  Upper Arm Region Severe depletion  Thoracic and Lumbar Region Severe depletion  Buccal Region Severe depletion  Temple Region Severe depletion  Clavicle Bone Region Severe depletion  Clavicle and Acromion Bone Region Severe depletion  Scapular Bone Region Severe depletion  Dorsal Hand Severe depletion  Patellar Region Severe depletion  Anterior Thigh Region Severe depletion  Posterior Calf Region Severe depletion  Edema (RD Assessment) None  Hair Reviewed  [falling out]  Eyes Reviewed  Mouth Reviewed  [no teeth, dentures do not fit]  Skin Reviewed  Nails Reviewed  [vertical ridges]      Diet Order:   Diet Order             Diet NPO time specified Except for: Sips with Meds  Diet effective now                  EDUCATION NEEDS:   Education needs have been addressed  Skin:  Skin  Assessment: Reviewed RN Assessment  Last BM:  02/09/22  Height:  Ht Readings from Last 1 Encounters:  02/11/22 _0  (1.575 m)   Weight:  Wt Readings from Last 1 Encounters:  02/11/22 33.4 kg   Ideal Body Weight:   50kg  BMI:  Body mass index is 13.47 kg/m.  Estimated Nutritional Needs:   Kcal:  1000-1200kcal  Protein:  50-70g  Fluid:  >1044m  KCandise Bowens MS, RD, LDN,  CNSC See AMiON for contact information

## 2022-02-11 NOTE — Progress Notes (Signed)
  Progress Note   Patient: Tabitha Ramos AVW:[id number] DOB: 1931-04-30 DOA: 02/10/2022     1 DOS: the patient was seen and examined on 02/11/2022   Brief hospital course: 88 y.o. female with medical history significant of glaucoma, HLD presents after mechanical fall from home. Pt reports that late previous week prior to admit, pt was cleaning condo after water heater leak. Around this time, pt got up from sofa to walk to front door when she tripped on carpeted floor, causing R hip pain and inability to walk. Symptoms persisted, prompting ED visit   In ED, pt was found to have R femoral neck fx on imaging. Orthopedic Surgery consulted. Hospitalist consulted for consideration for admission.  Assessment and Plan: R femoral neck fracture -s/p mechanical fall -Orthopedic Surgery consulted by EDP -Pt now s/p surgery 10/5 -Will f/u with PT recs   Glaucoma -Would resume home meds once confirmed by pharmacy   Fall -Without syncope -Would f/u on PT eval   Hypokalemia -repalced -Recheck bmet in AM      Subjective: Seen this AM prior to surgery. Without complaints  Physical Exam: Vitals:   02/11/22 0449 02/11/22 0640 02/11/22 1013 02/11/22 1430  BP:  (!) 103/48 (!) 145/74 118/62  Pulse:  68 91 64  Resp:  '18 18 16  '$ Temp:  97.6 F (36.4 C) 97.7 F (36.5 C) 97.8 F (36.6 C)  TempSrc:   Oral Oral  SpO2:  95% (!) 70% 98%  Weight: 33.4 kg     Height: '5\' 2"'$  (1.575 m)      General exam: Awake, laying in bed, in nad Respiratory system: Normal respiratory effort, no wheezing Cardiovascular system: regular rate, s1, s2 Gastrointestinal system: Soft, nondistended, positive BS Central nervous system: CN2-12 grossly intact, strength intact Extremities: Perfused, no clubbing Skin: Normal skin turgor, no notable skin lesions seen Psychiatry: Mood normal // no visual hallucinations   Data Reviewed:  Labs reviewed: Na 143, K 3.5, Cr 0.66  Family Communication: Pt in room, pt's family at  bedside  Disposition: Status is: Inpatient Remains inpatient appropriate because: severity of illness  Planned Discharge Destination: Home    Author: Marylu Lund, MD 02/11/2022 5:43 PM  For on call review [email or web address].

## 2022-02-11 NOTE — Plan of Care (Signed)

## 2022-02-11 NOTE — Op Note (Signed)
02/10/2022 - 02/11/2022  5:29 PM  PATIENT:  Tabitha Ramos   MRN: [id number]  PRE-OPERATIVE DIAGNOSIS:  RIGHT FEMORAL NECK FRACTURE  POST-OPERATIVE DIAGNOSIS:  RIGHT FEMORAL NECK FRACTURE  PROCEDURE:  Procedure(s): HEMI HIP ARTHROPLASTY  PREOPERATIVE INDICATIONS:  Tabitha Ramos is an 88 y.o. female who was admitted 02/10/2022 with a diagnosis of Femoral neck fracture ([place]) and elected for surgical management.  The risks benefits and alternatives were discussed with the patient including but not limited to the risks of nonoperative treatment, versus surgical intervention including infection, bleeding, nerve injury, periprosthetic fracture, the need for revision surgery, dislocation, leg length discrepancy, blood clots, cardiopulmonary complications, morbidity, mortality, among others, and they were willing to proceed.  Predicted outcome is good, although there will be at least a six to nine month expected recovery.   OPERATIVE REPORT     SURGEON:  Charlies Constable, MD    ASSISTANT:    (Present throughout the entire procedure,  necessary for completion of procedure in a timely manner, assisting with retraction, instrumentation, and closure)     ANESTHESIA: General  ESTIMATED BLOOD LOSS: 491PH    COMPLICATIONS:  None.     COMPONENTS:  [employer] Accolade C size 5 cemented with 127 degree offset, 28+0 cobalt chrome inner ball, 28/44 bipolar outer ball Implant Name Type Inv. Item Serial No. Manufacturer Lot No. LRB No. Used Action  CEMENT BONE SIMPLEX SPEEDSET - [id number] Cement CEMENT BONE SIMPLEX SPEEDSET  [employer] [id number] Right 1 Implanted  CEMENT BONE SIMPLEX SPEEDSET - [id number] Cement CEMENT BONE SIMPLEX SPEEDSET  [employer] [id number] Right 1 Implanted  STEM HIP ACCOLADE SZ5 [id number] - [id number] Stem STEM HIP ACCOLADE SZ5 [id number]  [employer] [id number] Right 1 Implanted  HEAD OSTEO BIPOLAR - [id number] Orthopedic Implant HEAD OSTEO BIPOLAR  [employer] [id number]  Right 1 Implanted  FEMORAL HEAD LFIT V40 28MM PL0 - [id number] Orthopedic Implant FEMORAL HEAD LFIT V40 28MM PL0  [employer] [id number] Right 1 Implanted      PROCEDURE IN DETAIL: The patient was met in the holding area and identified.  The appropriate hip  was marked at the operative site. The patient was then transported to the OR and  placed under anesthesia.  At that point, the patient was  placed in the lateral decubitus position with the operative side up and  secured to the operating room table and all bony prominences padded. A subaxillary role was placed.    The operative lower extremity was prepped from the iliac crest to the ankle.  Sterile draping was performed.  2g of ancef and 1g TXA were given prior to incision. Time out was performed prior to incision.      A routine posterolateral approach was utilized via sharp dissection  carried down to the subcutaneous tissue.  Gross bleeders were Bovie  coagulated.  The iliotibial band was identified and incised  along the length of the skin incision.  A Charnley retractor was inserted with care to protect the sciatic nerve.  With the hip internally rotated, the short external rotators  were identified. The piriformis was tagged with #5 Ethibond, and the hip capsule released in a T-type fashion, and posterior sleeve of the capsule was also tagged.  The femoral neck was exposed, and I resected the femoral neck using the appropriate jig. This was performed at approximately a thumb's breadth above the lesser trochanter.    I then exposed the deep acetabulum, cleared out any tissue including the ligamentum teres.  I then prepared the proximal femur using the box cutter, Charnley awl, and then sequentially broached.  A trial utilized, and I reduced the hip, leg lengths were assessed clinically and felt to be equal. The hip was then taken through a full range of motion, the hip was stable at full extension and 90 degrees external rotation  without anterior subluxation. The hip was also stable in the position of sleep, and in neutral abduction up to 90 degrees flexion, and 90 degrees IR. The trial components were then removed.   We then prepared canal for cementation.  The cement restrictor was measured and inserted distally.  The canal was then irrigated with the pulse lavage and 3 L of normal saline.  2 bags of Simplex cement were prepared.  Using the cement gun the cement was inserted distally and the canal was filled.  We then pressurized the canal. The real implant was then inserted matching the patient's native anteversion of approximately 25 degrees.  We then waited for 15 minutes for the cement to be fully set.  Excess cement was removed.  A lap was placed in the acetabulum prior to cementing was also removed and the acetabulum was assessed to make sure there was no cement or bone fragments.  The hip was then reduced with the trial head again and taken through functional range of motion and found to have excellent stability. Leg lengths were restored. The real head was then impacted onto the stem and the hip was again reduced.  The capsule was then repaired with #5 Ethibond., and the piriformis was repaired to the abductor tendon. Excellent posterior capsular repair was achieved.   I then irrigated the hip copiously again with pulse lavage. The wounds were injected with 20cc exparal diluted in sterile saline. The fascia and IT band was repaired with #1 stratafix, followed by 0 stratafix for the fat layer followed by 2-0 Vicryl and running 3-0 Monocryl for the skin, Dermabond was applied and an Aquacel dressing was placed.  The patient was then awakened and returned to PACU in stable and satisfactory condition. There were no complications.  Post op recs: WB: WBAT, no formal hip precautions Abx: ancef x23 hours post op Imaging: PACU xrays Dressing: Aquacel dressing to be kept intact until follow-up DVT prophylaxis:aspirin 59m BID  starting POD1 x4 weeks Follow up: 2 weeks after surgery [facility]  [address] [place] [address] Office Phone: [phone]  DCharlies Constable MD Orthopedic Surgeon  02/11/2022 5:29 PM

## 2022-02-11 NOTE — Consult Note (Signed)
ORTHOPAEDIC CONSULTATION  REQUESTING PHYSICIAN: Donne Hazel, MD  Chief Complaint: R displaced femoral neck fracture  HPI: Tabitha Ramos is a 88 y.o. female sustained a fall about 1 to 2 weeks ago.  She had difficulty moving her right leg and unable to ambulate and put pressure on the leg for the past week or 2.  She had a brother at home helping get her around.  Prior to the fall she was an independent ambulator without assistive device.  Given the continued pain she finally came to the emergency room x-rays demonstrated displaced femoral neck fracture.  She denies pain in the joints or extremities she denies distal numbness and tingling she denies any hip pain before the fall.  She has been on medication for osteoporosis in the past.  Past Medical History:  Diagnosis Date   Adenomatous colon polyp 07/2000   tubulovillous adenoma on colonoscopy (Dr. Lajoyce Corners); Colonoscopy 2006 Dr. Olevia Perches hyperplastic polyp only   Former smoker    quit 2017   Glaucoma    Hepatic hemangioma 2004   noted on CT 2001, 2004   Hiatal hernia    small, noted on CT (2004)   Hypercholesteremia    Impaired fasting glucose    Multiple thyroid nodules    [place] 11/2018, benign   Osteoporosis    [place] 2007-2010, 2013-2017 (stopped for oral surgery, never restarted); 03/2012 T-3.5 L fem neck, -2.9 spine   Past Surgical History:  Procedure Laterality Date   APPENDECTOMY     CATARACT EXTRACTION, BILATERAL Bilateral    in her 43's   TONSILLECTOMY     TOTAL ABDOMINAL HYSTERECTOMY W/ BILATERAL SALPINGOOPHORECTOMY  1942   fibroids   Social History   Socioeconomic History   Marital status: Widowed    Spouse name: Not on file   Number of children: Not on file   Years of education: Not on file   Highest education level: Not on file  Occupational History   Not on file  Tobacco Use   Smoking status: Former    Types: Cigarettes    Quit date: 07/10/2013    Years since quitting: 8.5   Smokeless tobacco: Never   Substance and Sexual Activity   Alcohol use: Not Currently    Comment: none in 30 years   Drug use: Never   Sexual activity: Not Currently  Other Topics Concern   Not on file  Social History Narrative   Widowed (husband was a diabetic)   Living in a condo; brother lives with her.   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      Updated 07/2021   Social Determinants of Health   Financial Resource Strain: Low Risk  (08/24/2021)   Overall Financial Resource Strain (CARDIA)    Difficulty of Paying Living Expenses: Not very hard  Food Insecurity: No Food Insecurity (02/10/2022)   Hunger Vital Sign    Worried About Running Out of Food in the Last Year: Never true    Ran Out of Food in the Last Year: Never true  Transportation Needs: No Transportation Needs (02/10/2022)   PRAPARE - Hydrologist (Medical): No    Lack of Transportation (Non-Medical): No  Physical Activity: Not on file  Stress: Not on file  Social Connections: Not on file  Family History  Problem Relation Age of Onset   Diabetes Son    Cirrhosis Son    Bladder Cancer Brother    Alcoholism Brother    No Known Allergies   Positive ROS: All other systems have been reviewed and were otherwise negative with the exception of those mentioned in the HPI and as above.  Physical Exam: General: Alert, no acute distress, thin female Cardiovascular: No pedal edema Respiratory: No cyanosis, no use of accessory musculature Skin: No lesions in the area of chief complaint Neurologic: Sensation intact distally Psychiatric: Patient is competent for consent with normal mood and affect  MUSCULOSKELETAL:  LLE No traumatic wounds, ecchymosis, or rash  Nontender  No groin pain with log roll  No knee or ankle effusion  Knee stable to varus/ valgus  stress  Sens DPN, SPN, TN intact  Motor EHL, ext, flex 5/5  DP 2+, No significant edema  LLE No traumatic wounds, ecchymosis, or rash  Nontender  No groin pain with log roll  No knee or ankle effusion  Knee stable to varus/ valgus stress  Sens DPN, SPN, TN intact  Motor EHL, ext, flex 5/5  DP 2+, PT 2+, No significant edema   IMAGING: X-rays pelvis right hip right femur reviewed demonstrate displaced right femoral neck fracture  Assessment: Principal Problem:   Femoral neck fracture (HCC)  Right displaced femoral neck fracture  Plan: Had a good conversation with the patient at bedside today.  Discussed that her x-ray findings demonstrate displaced femoral neck fracture.  Do not feel that given her displacement it is amenable to fixation.  Recommend arthroplasty.  Discussed merits of partial versus total hip arthroplasty.  Discussed advantages of hip hemiarthroplasty including increased stability and less invasive surgery.  We will assess intraoperatively to assess for acetabular wear and Intra-Op stability.  Plan for cemented stem fixation given history of osteoporosis and poor bone quality.  The risks benefits and alternatives were discussed with the patient including but not limited to the risks of nonoperative treatment, versus surgical intervention including infection, bleeding, nerve injury, periprosthetic fracture, the need for revision surgery, dislocation, leg length discrepancy, blood clots, cardiopulmonary complications, morbidity, mortality, among others, and they were willing to proceed.       Willaim Sheng, MD  Contact information:   [facility] 7am-5pm epic message Dr. Zachery Dakins, or call office for patient follow up: [phone] After hours and holidays please check [email or web address] for group call information for [facility]

## 2022-02-11 NOTE — Anesthesia Preprocedure Evaluation (Addendum)
Anesthesia Evaluation  Patient identified by MRN, date of birth, ID band Patient awake    Reviewed: Allergy & Precautions, NPO status , Patient's Chart, lab work & pertinent test results  History of Anesthesia Complications Negative for: history of anesthetic complications  Airway Mallampati: I  TM Distance: >3 FB Neck ROM: Full    Dental  (+) Edentulous Upper, Edentulous Lower   Pulmonary former smoker,    breath sounds clear to auscultation       Cardiovascular negative cardio ROS   Rhythm:Regular Rate:Normal     Neuro/Psych glaucoma    GI/Hepatic negative GI ROS, Neg liver ROS,   Endo/Other  negative endocrine ROS  Renal/GU negative Renal ROS     Musculoskeletal  (+) Arthritis ,   Abdominal   Peds  Hematology negative hematology ROS (+)   Anesthesia Other Findings   Reproductive/Obstetrics                            Anesthesia Physical Anesthesia Plan  ASA: 3  Anesthesia Plan: General   Post-op Pain Management: Tylenol PO (pre-op)*   Induction: Intravenous  PONV Risk Score and Plan: 3 and Ondansetron, Dexamethasone and Treatment may vary due to age or medical condition  Airway Management Planned: Oral ETT  Additional Equipment: None  Intra-op Plan:   Post-operative Plan: Extubation in OR  Informed Consent: I have reviewed the patients History and Physical, chart, labs and discussed the procedure including the risks, benefits and alternatives for the proposed anesthesia with the patient or authorized representative who has indicated his/her understanding and acceptance.       Plan Discussed with: CRNA and Surgeon  Anesthesia Plan Comments: (Pt declines SAB)        Anesthesia Quick Evaluation

## 2022-02-12 ENCOUNTER — Encounter (HOSPITAL_COMMUNITY): Payer: Self-pay | Admitting: Orthopedic Surgery

## 2022-02-12 DIAGNOSIS — E43 Unspecified severe protein-calorie malnutrition: Secondary | ICD-10-CM | POA: Diagnosis not present

## 2022-02-12 DIAGNOSIS — W19XXXA Unspecified fall, initial encounter: Secondary | ICD-10-CM | POA: Diagnosis not present

## 2022-02-12 DIAGNOSIS — S72001A Fracture of unspecified part of neck of right femur, initial encounter for closed fracture: Secondary | ICD-10-CM | POA: Diagnosis not present

## 2022-02-12 LAB — COMPREHENSIVE METABOLIC PANEL
ALT: 14 U/L (ref 0–44)
AST: 17 U/L (ref 15–41)
Albumin: 2.8 g/dL — ABNORMAL LOW (ref 3.5–5.0)
Alkaline Phosphatase: 55 U/L (ref 38–126)
Anion gap: 8 (ref 5–15)
BUN: 13 mg/dL (ref 8–23)
CO2: 26 mmol/L (ref 22–32)
Calcium: 8.4 mg/dL — ABNORMAL LOW (ref 8.9–10.3)
Chloride: 105 mmol/L (ref 98–111)
Creatinine, Ser: <0.3 mg/dL — ABNORMAL LOW (ref 0.44–1.00)
Glucose, Bld: 167 mg/dL — ABNORMAL HIGH (ref 70–99)
Potassium: 3.7 mmol/L (ref 3.5–5.1)
Sodium: 139 mmol/L (ref 135–145)
Total Bilirubin: 0.8 mg/dL (ref 0.3–1.2)
Total Protein: 5.2 g/dL — ABNORMAL LOW (ref 6.5–8.1)

## 2022-02-12 LAB — CBC
HCT: 36.6 % (ref 36.0–46.0)
Hemoglobin: 11.6 g/dL — ABNORMAL LOW (ref 12.0–15.0)
MCH: 29.5 pg (ref 26.0–34.0)
MCHC: 31.7 g/dL (ref 30.0–36.0)
MCV: 93.1 fL (ref 80.0–100.0)
Platelets: 201 10*3/uL (ref 150–400)
RBC: 3.93 MIL/uL (ref 3.87–5.11)
RDW: 13.3 % (ref 11.5–15.5)
WBC: 7.6 10*3/uL (ref 4.0–10.5)
nRBC: 0 % (ref 0.0–0.2)

## 2022-02-12 MED ORDER — NETARSUDIL-LATANOPROST 0.02-0.005 % OP SOLN
1.0000 [drp] | Freq: Every day | OPHTHALMIC | Status: DC
  Administered 2022-02-13: 1 [drp] via OPHTHALMIC

## 2022-02-12 NOTE — Progress Notes (Signed)
Physical Therapy Treatment Patient Details Name: Tabitha Ramos MRN: [id number] DOB: [birth year] Today's Date: 02/12/2022   History of Present Illness Pt admitted from home s/p fall with R hip fx and now s/p hemi-arthroplasty by posterior approach.    PT Comments    Pt continues very cooperative and up to ambulate limited distance in hall with assist for balance and RW management.  Per RN, pt's family has expressed ability to provide pt with 24/7 assist on discharge changing PT recommendation to [place].   Recommendations for follow up therapy are one component of a multi-disciplinary discharge planning process, led by the attending physician.  Recommendations may be updated based on patient status, additional functional criteria and insurance authorization.  Follow Up Recommendations  Home health PT Can patient physically be transported by private vehicle: Yes   Assistance Recommended at Discharge Frequent or constant Supervision/Assistance  Patient can return home with the following A little help with bathing/dressing/bathroom;Assistance with cooking/housework;Assist for transportation;Help with stairs or ramp for entrance;A little help with walking and/or transfers   Equipment Recommendations  Rolling walker (2 wheels)    Recommendations for Other Services       Precautions / Restrictions Precautions Precautions: Fall Restrictions Weight Bearing Restrictions: No Other Position/Activity Restrictions: WBAT     Mobility  Bed Mobility Overal bed mobility: Needs Assistance Bed Mobility: Sit to Supine     Supine to sit: Min assist, Mod assist Sit to supine: Min assist, Mod assist   General bed mobility comments: cues for sequence and use of L LE to self assist    Transfers Overall transfer level: Needs assistance Equipment used: Rolling walker (2 wheels) Transfers: Sit to/from Stand Sit to Stand: Min assist           General transfer comment: cues for LE management  and use of UEs to self assist    Ambulation/Gait Ambulation/Gait assistance: Min assist Gait Distance (Feet): 42 Feet Assistive device: Rolling walker (2 wheels) Gait Pattern/deviations: Step-to pattern, Decreased step length - right, Decreased step length - left, Shuffle, Trunk flexed       General Gait Details: cues for sequence, posture and position from [name]             Wheelchair Mobility    Modified Rankin (Stroke Patients Only)       Balance Overall balance assessment: Needs assistance Sitting-balance support: Feet supported, No upper extremity supported Sitting balance-Leahy Scale: Good     Standing balance support: Bilateral upper extremity supported Standing balance-Leahy Scale: Poor                              Cognition Arousal/Alertness: Awake/alert Behavior During Therapy: WFL for tasks assessed/performed Overall Cognitive Status: Within Functional Limits for tasks assessed                                          Exercises Total Joint Exercises Ankle Circles/Pumps: AROM, Both, 15 reps, Supine Quad Sets: AROM, Both, 10 reps, Supine Heel Slides: AAROM, Right, 15 reps, Supine Hip ABduction/ADduction: AAROM, Right, 15 reps, Supine    General Comments        Pertinent Vitals/Pain Pain Assessment Pain Assessment: 0-10 Pain Score: 4  Pain Location: R hip Pain Descriptors / Indicators: Aching, Grimacing, Sore Pain Intervention(s): Limited activity within patient's tolerance, Monitored during session,  Premedicated before session    [place] expects to be discharged to:: Private residence Living Arrangements: Alone Available Help at Discharge: Family;Available 24 hours/day Type of Home: Other(Comment) (townhouse) Home Access: Stairs to enter Entrance Stairs-Rails: None Entrance Stairs-Number of Steps: 2 Alternate Level Stairs-Number of Steps: flight Home Layout: Two level;Able to live  on main level with bedroom/bathroom;1/2 bath on main level Home Equipment: Cane - single point Additional Comments: Pt states she thinks her grandson and his wife can stay with her at discharge    Prior Function            PT Goals (current goals can now be found in the care plan section) Acute Rehab PT Goals Patient Stated Goal: REgain IND PT Goal Formulation: With patient Time For Goal Achievement: 02/19/22 Potential to Achieve Goals: Good Progress towards PT goals: Progressing toward goals    Frequency    7X/week      PT Plan Discharge plan needs to be updated    Co-evaluation              AM-PAC PT "6 Clicks" Mobility   Outcome Measure  Help needed turning from your back to your side while in a flat bed without using bedrails?: A Lot Help needed moving from lying on your back to sitting on the side of a flat bed without using bedrails?: A Lot Help needed moving to and from a bed to a chair (including a wheelchair)?: A Little Help needed standing up from a chair using your arms (e.g., wheelchair or bedside chair)?: A Little Help needed to walk in hospital room?: A Little Help needed climbing 3-5 steps with a railing? : A Lot 6 Click Score: 15    End of Session Equipment Utilized During Treatment: Gait belt Activity Tolerance: Patient tolerated treatment well Patient left: in bed;with bed alarm set;with nursing/sitter in room Nurse Communication: Mobility status PT Visit Diagnosis: Difficulty in walking, not elsewhere classified (R26.2)     Time: 0947-0962 PT Time Calculation (min) (ACUTE ONLY): 21 min  Charges:  $Gait Training: 8-22 mins $Therapeutic Exercise: 8-22 mins                     [place] Pager [phone] Office [phone]    Anira Senegal 02/12/2022, 3:55 PM

## 2022-02-12 NOTE — Progress Notes (Signed)
  Progress Note   Patient: Tabitha Ramos KNL:[id number] DOB: 11-Nov-1930 DOA: 02/10/2022     2 DOS: the patient was seen and examined on 02/12/2022   Brief hospital course: 88 y.o. female with medical history significant of glaucoma, HLD presents after mechanical fall from home. Pt reports that late previous week prior to admit, pt was cleaning condo after water heater leak. Around this time, pt got up from sofa to walk to front door when she tripped on carpeted floor, causing R hip pain and inability to walk. Symptoms persisted, prompting ED visit   In ED, pt was found to have R femoral neck fx on imaging. Orthopedic Surgery consulted. Hospitalist consulted for consideration for admission.  Assessment and Plan: R femoral neck fracture -s/p mechanical fall -Orthopedic Surgery consulted by EDP -Pt now s/p surgery 10/5 -Therapy recs for SNF. Have consulted TOC   Glaucoma -Resumed home eye drops   Fall -Without syncope -PT following   Hypokalemia -repalced -Recheck bmet in AM      Subjective: Seen this AM. Requested help with eating breakfast  Physical Exam: Vitals:   02/11/22 2224 02/12/22 0135 02/12/22 1029 02/12/22 1357  BP: (!) 109/56 (!) 104/53 (!) 101/54 (!) 90/48  Pulse: 71 (!) 54 83 72  Resp: '16 17 16 16  '$ Temp: 97.8 F (36.6 C) 98.3 F (36.8 C) 98.5 F (36.9 C) 98.1 F (36.7 C)  TempSrc: Oral Oral Oral Oral  SpO2: 100% 100% 94% 100%  Weight:      Height:       General exam: Conversant, in no acute distress Respiratory system: normal chest rise, clear, no audible wheezing Cardiovascular system: regular rhythm, s1-s2 Gastrointestinal system: Nondistended, nontender, pos BS Central nervous system: No seizures, no tremors Extremities: No cyanosis, no joint deformities Skin: No rashes, no pallor Psychiatry: Affect normal // no auditory hallucinations   Data Reviewed:  Labs reviewed: Na 139, K 3.7, Cr <0.30  Family Communication: Pt in room, family not at  bedside  Disposition: Status is: Inpatient Remains inpatient appropriate because: severity of illness  Planned Discharge Destination: Home    Author: Marylu Lund, MD 02/12/2022 3:51 PM  For on call review [email or web address].

## 2022-02-12 NOTE — Evaluation (Signed)
Physical Therapy Evaluation Patient Details Name: Tabitha Ramos MRN: [id number] DOB: 06-24-1930 Today's Date: 02/12/2022  History of Present Illness  Pt admitted from home s/p fall with R hip fx and now s/p hemi-arthroplasty by posterior approach.  Clinical Impression  Pt admitted as above and presenting with functional mobility limitations 2* decreased R LE strength/ROM and post op pain.  Pt currently requiring assist for all basic mobility tasks and would benefit from follow up SNF level rehab to maximize IND and safety unless family is able to provide 24/7 assist.  Pt reports she thinks her grandson and his wife may be able to stay with her.     Recommendations for follow up therapy are one component of a multi-disciplinary discharge planning process, led by the attending physician.  Recommendations may be updated based on patient status, additional functional criteria and insurance authorization.  Follow Up Recommendations Skilled nursing-short term rehab (<3 hours/day) (unless family can offer 24/7 assist at home) Can patient physically be transported by private vehicle: No    Assistance Recommended at Discharge Frequent or constant Supervision/Assistance  Patient can return home with the following  A lot of help with walking and/or transfers;A little help with bathing/dressing/bathroom;Assistance with cooking/housework;Assist for transportation;Help with stairs or ramp for entrance    Equipment Recommendations Rolling walker (2 wheels)  Recommendations for Other Services       Functional Status Assessment Patient has had a recent decline in their functional status and demonstrates the ability to make significant improvements in function in a reasonable and predictable amount of time.     Precautions / Restrictions Precautions Precautions: Fall Restrictions Weight Bearing Restrictions: No Other Position/Activity Restrictions: WBAT      Mobility  Bed Mobility Overal bed  mobility: Needs Assistance Bed Mobility: Supine to Sit     Supine to sit: Min assist, Mod assist     General bed mobility comments: cues for sequence and use of L LE to self assist    Transfers Overall transfer level: Needs assistance Equipment used: Rolling walker (2 wheels) Transfers: Sit to/from Stand Sit to Stand: Min assist, Mod assist           General transfer comment: cues for LE management and use of UEs to self assist    Ambulation/Gait Ambulation/Gait assistance: Min assist Gait Distance (Feet): 5 Feet Assistive device: Rolling walker (2 wheels) Gait Pattern/deviations: Step-to pattern, Decreased step length - right, Decreased step length - left, Shuffle, Trunk flexed       General Gait Details: cues for sequence, posture and position from [employer]            Wheelchair Mobility    Modified Rankin (Stroke Patients Only)       Balance Overall balance assessment: Needs assistance Sitting-balance support: Feet supported, No upper extremity supported Sitting balance-Leahy Scale: Good     Standing balance support: Bilateral upper extremity supported Standing balance-Leahy Scale: Poor                               Pertinent Vitals/Pain Pain Assessment Pain Assessment: 0-10 Pain Score: 5  Pain Location: R hip Pain Descriptors / Indicators: Aching, Grimacing, Sore Pain Intervention(s): Limited activity within patient's tolerance, Monitored during session, Premedicated before session    Home Living Family/patient expects to be discharged to:: Private residence Living Arrangements: Alone Available Help at Discharge: Family;Available 24 hours/day Type of Home: Other(Comment) (townhouse) Home Access: Stairs to  enter Entrance Stairs-Rails: None Entrance Stairs-Number of Steps: 2 Alternate Level Stairs-Number of Steps: flight Home Layout: Two level;Able to live on main level with bedroom/bathroom;1/2 bath on main level Home  Equipment: Cane - single point Additional Comments: Pt states she thinks her grandson and his wife can stay with her at discharge    Prior Function Prior Level of Function : Independent/Modified Independent                     Hand Dominance        Extremity/Trunk Assessment   Upper Extremity Assessment Upper Extremity Assessment: Generalized weakness    Lower Extremity Assessment Lower Extremity Assessment: Generalized weakness;RLE deficits/detail RLE Deficits / Details: AAROM at hip to 90 flex and 15 abd; strength at hip to 2/5    Cervical / Trunk Assessment Cervical / Trunk Assessment: Kyphotic  Communication   Communication: No difficulties  Cognition Arousal/Alertness: Awake/alert Behavior During Therapy: WFL for tasks assessed/performed Overall Cognitive Status: Within Functional Limits for tasks assessed                                          General Comments      Exercises Total Joint Exercises Ankle Circles/Pumps: AROM, Both, 15 reps, Supine Quad Sets: AROM, Both, 10 reps, Supine Heel Slides: AAROM, Right, 15 reps, Supine Hip ABduction/ADduction: AAROM, Right, 15 reps, Supine   Assessment/Plan    PT Assessment Patient needs continued PT services  PT Problem List Decreased strength;Decreased range of motion;Decreased activity tolerance;Decreased balance;Decreased mobility;Decreased knowledge of use of DME;Pain       PT Treatment Interventions DME instruction;Gait training;Stair training;Functional mobility training;Therapeutic activities;Therapeutic exercise;Balance training;Patient/family education    PT Goals (Current goals can be found in the Care Plan section)  Acute Rehab PT Goals Patient Stated Goal: REgain IND PT Goal Formulation: With patient Time For Goal Achievement: 02/19/22 Potential to Achieve Goals: Good    Frequency 7X/week     Co-evaluation               AM-PAC PT "6 Clicks" Mobility  Outcome  Measure Help needed turning from your back to your side while in a flat bed without using bedrails?: A Lot Help needed moving from lying on your back to sitting on the side of a flat bed without using bedrails?: A Lot Help needed moving to and from a bed to a chair (including a wheelchair)?: A Lot Help needed standing up from a chair using your arms (e.g., wheelchair or bedside chair)?: A Lot Help needed to walk in hospital room?: Total Help needed climbing 3-5 steps with a railing? : Total 6 Click Score: 10    End of Session Equipment Utilized During Treatment: Gait belt Activity Tolerance: Patient tolerated treatment well Patient left: in chair;with call bell/phone within reach;with chair alarm set Nurse Communication: Mobility status PT Visit Diagnosis: Difficulty in walking, not elsewhere classified (R26.2)    Time: 1133-1208 PT Time Calculation (min) (ACUTE ONLY): 35 min   Charges:   PT Evaluation $PT Eval Low Complexity: 1 Low PT Treatments $Therapeutic Exercise: 8-22 mins        Debe Coder PT Acute Rehabilitation Services Pager [phone] Office [phone]   Cayenne Breault 02/12/2022, 12:55 PM

## 2022-02-12 NOTE — Progress Notes (Signed)
     Subjective:  Patient reports pain as mild.  Lying comfortably in bed this morning. No issues overnight. Encouraged PO intake.  Objective:   VITALS:   Vitals:   02/11/22 1945 02/11/22 2033 02/11/22 2224 02/12/22 0135  BP: (!) 101/47 (!) 116/56 (!) 109/56 (!) 104/53  Pulse: 80 60 71 (!) 54  Resp: 16 18 16 17   Temp: 97.6 F (36.4 C) 98.8 F (37.1 C) 97.8 F (36.6 C) 98.3 F (36.8 C)  TempSrc:  Oral Oral Oral  SpO2: 100% 98% 100% 100%  Weight:      Height:        Sensation intact distally Intact pulses distally Dorsiflexion/Plantar flexion intact Incision: dressing C/D/I Compartment soft   Lab Results  Component Value Date   WBC 7.6 02/12/2022   HGB 11.6 (L) 02/12/2022   HCT 36.6 02/12/2022   MCV 93.1 02/12/2022   PLT 201 02/12/2022   BMET    Component Value Date/Time   NA 139 02/12/2022 0338   NA 136 07/14/2021 1010   K 3.7 02/12/2022 0338   CL 105 02/12/2022 0338   CO2 26 02/12/2022 0338   GLUCOSE 167 (H) 02/12/2022 0338   BUN 13 02/12/2022 0338   BUN 16 07/14/2021 1010   CREATININE <0.30 (L) 02/12/2022 0338   CALCIUM 8.4 (L) 02/12/2022 0338   EGFR 79 07/14/2021 1010   GFRNONAA NOT CALCULATED 02/12/2022 3967      Xray: xrays pelvis R hip demonstrate cemented R hip hemiarthroplasty with no adverse features  Assessment/Plan: 1 Day Post-Op   Principal Problem:   Femoral neck fracture (HCC)  S/p R hip hemiarthropalsty for FN fx 02/11/22   Post op recs: WB: WBAT, no formal hip precautions Abx: ancef x23 hours post op Imaging: PACU xrays Dressing: Aquacel dressing to be kept intact until follow-up DVT prophylaxis:aspirin 81mg  BID starting POD1 x4 weeks Follow up: 2 weeks after surgery for a wound check with Dr. Zachery Dakins at [facility].  Address: [address] [place], [place], [place] [address]  Office Phone: [phone]    Willaim Sheng 02/12/2022, 6:27 AM   Charlies Constable, MD  Contact information:    [phone] 7am-5pm epic message Dr. Zachery Dakins, or call office for patient follow up: [phone] After hours and holidays please check [email or web address] for group call information for [facility]

## 2022-02-13 DIAGNOSIS — S72001A Fracture of unspecified part of neck of right femur, initial encounter for closed fracture: Secondary | ICD-10-CM | POA: Diagnosis not present

## 2022-02-13 DIAGNOSIS — E43 Unspecified severe protein-calorie malnutrition: Secondary | ICD-10-CM | POA: Diagnosis not present

## 2022-02-13 MED ORDER — OXYCODONE HCL 5 MG PO TABS: 2.5000 mg | ORAL_TABLET | ORAL | 0 refills | Status: AC | PRN

## 2022-02-13 MED ORDER — ASPIRIN 81 MG PO TBEC: 81.0000 mg | DELAYED_RELEASE_TABLET | Freq: Two times a day (BID) | ORAL | 0 refills | Status: AC

## 2022-02-13 NOTE — Progress Notes (Signed)
Physical Therapy Treatment Patient Details Name: Tabitha Ramos MRN: [id number] DOB: [birth year] Today's Date: 02/13/2022   History of Present Illness Pt admitted from home s/p fall with R hip fx and now s/p hemi-arthroplasty by posterior approach.    PT Comments    Pt continues cooperative but with c/o increased pain and fatigue this pm.  Pt up to ambulate with assist and then assisted to bed.   Recommendations for follow up therapy are one component of a multi-disciplinary discharge planning process, led by the attending physician.  Recommendations may be updated based on patient status, additional functional criteria and insurance authorization.  Follow Up Recommendations  Home health PT Can patient physically be transported by private vehicle: Yes   Assistance Recommended at Discharge Frequent or constant Supervision/Assistance  Patient can return home with the following A little help with bathing/dressing/bathroom;Assistance with cooking/housework;Assist for transportation;Help with stairs or ramp for entrance;A little help with walking and/or transfers   Equipment Recommendations  Rolling walker (2 wheels)    Recommendations for Other Services       Precautions / Restrictions Precautions Precautions: Fall Restrictions Weight Bearing Restrictions: No Other Position/Activity Restrictions: WBAT     Mobility  Bed Mobility Overal bed mobility: Needs Assistance Bed Mobility: Sit to Supine     Supine to sit: Min assist, HOB elevated Sit to supine: Min assist, Mod assist   General bed mobility comments: cues for sequence and use of L LE to self assist    Transfers Overall transfer level: Needs assistance Equipment used: Rolling walker (2 wheels) Transfers: Sit to/from Stand Sit to Stand: Min assist, Min guard           General transfer comment: cues for LE management and use of UEs to self assist    Ambulation/Gait Ambulation/Gait assistance: Min guard Gait  Distance (Feet): 50 Feet Assistive device: Rolling walker (2 wheels) Gait Pattern/deviations: Step-to pattern, Decreased step length - right, Decreased step length - left, Shuffle, Trunk flexed       General Gait Details: min cues for sequence, posture and position from [name]             Wheelchair Mobility    Modified Rankin (Stroke Patients Only)       Balance Overall balance assessment: Needs assistance Sitting-balance support: Feet supported, No upper extremity supported Sitting balance-Leahy Scale: Good     Standing balance support: Single extremity supported Standing balance-Leahy Scale: Poor                              Cognition Arousal/Alertness: Awake/alert Behavior During Therapy: WFL for tasks assessed/performed Overall Cognitive Status: Within Functional Limits for tasks assessed                                          Exercises Total Joint Exercises Ankle Circles/Pumps: AROM, Both, 15 reps, Supine Quad Sets: AROM, Both, 10 reps, Supine Heel Slides: AAROM, Right, 15 reps, Supine Hip ABduction/ADduction: AAROM, Right, 15 reps, Supine    General Comments        Pertinent Vitals/Pain Pain Assessment Pain Assessment: 0-10 Pain Score: 6  Pain Location: R hip Pain Descriptors / Indicators: Aching, Grimacing, Sore Pain Intervention(s): Limited activity within patient's tolerance, Monitored during session, Premedicated before session, Ice applied, Repositioned    Home Living  Prior Function            PT Goals (current goals can now be found in the care plan section) Acute Rehab PT Goals Patient Stated Goal: REgain IND PT Goal Formulation: With patient Time For Goal Achievement: 02/19/22 Potential to Achieve Goals: Good Progress towards PT goals: Progressing toward goals    Frequency    7X/week      PT Plan Discharge plan needs to be updated     Co-evaluation              AM-PAC PT "6 Clicks" Mobility   Outcome Measure  Help needed turning from your back to your side while in a flat bed without using bedrails?: A Little Help needed moving from lying on your back to sitting on the side of a flat bed without using bedrails?: A Little Help needed moving to and from a bed to a chair (including a wheelchair)?: A Little Help needed standing up from a chair using your arms (e.g., wheelchair or bedside chair)?: A Little Help needed to walk in hospital room?: A Little Help needed climbing 3-5 steps with a railing? : A Lot 6 Click Score: 17    End of Session Equipment Utilized During Treatment: Gait belt Activity Tolerance: Patient tolerated treatment well Patient left: in bed;with call bell/phone within reach;with bed alarm set Nurse Communication: Mobility status PT Visit Diagnosis: Difficulty in walking, not elsewhere classified (R26.2)     Time: 7711-6579 PT Time Calculation (min) (ACUTE ONLY): 19 min  Charges:  $Gait Training: 8-22 mins $Therapeutic Exercise: 8-22 mins                     [place] Pager [phone] Office [phone]    Amarachi Kotz 02/13/2022, 3:19 PM

## 2022-02-13 NOTE — TOC Progression Note (Signed)
Transition of Care [facility]) - Progression Note    Patient Details  Name: DELRAE HAGEY MRN: [id number] Date of Birth: 06/09/1930  Transition of Care [facility]) CM/SW Contact  Henrietta Dine, RN Phone Number: 02/13/2022, 12:11 PM  Clinical Narrative:    Consult for HHPT and RW; spoke with patient in room and she agrees to the plan of care; spoke with [facility] at [place] and she will deliver RW to room; also contacted Sharmon Revere at [facility] and they will provide HHPT; TOC will con't to follow.          Expected Discharge Plan and Services                                                 Social Determinants of Health (SDOH) Interventions    Readmission Risk Interventions     No data to display

## 2022-02-13 NOTE — Progress Notes (Signed)
Physical Therapy Treatment Patient Details Name: Tabitha Ramos MRN: [id number] DOB: [birth year] Today's Date: 02/13/2022   History of Present Illness Pt admitted from home s/p fall with R hip fx and now s/p hemi-arthroplasty by posterior approach.    PT Comments    Pt continues very cooperative and progressing steadily with mobility.  Pt with no "formal hip precautions"  but found this am in bed with R hip internally rotated with increased pain. Pt tolerated therex program and up to ambulate increased distance in hall before transitioning to recliner and R LE positioned and supported in neutral position.  Recommendations for follow up therapy are one component of a multi-disciplinary discharge planning process, led by the attending physician.  Recommendations may be updated based on patient status, additional functional criteria and insurance authorization.  Follow Up Recommendations  Home health PT Can patient physically be transported by private vehicle: Yes   Assistance Recommended at Discharge Frequent or constant Supervision/Assistance  Patient can return home with the following A little help with bathing/dressing/bathroom;Assistance with cooking/housework;Assist for transportation;Help with stairs or ramp for entrance;A little help with walking and/or transfers   Equipment Recommendations  Rolling walker (2 wheels)    Recommendations for Other Services       Precautions / Restrictions Precautions Precautions: Fall Restrictions Weight Bearing Restrictions: No Other Position/Activity Restrictions: WBAT     Mobility  Bed Mobility Overal bed mobility: Needs Assistance Bed Mobility: Supine to Sit     Supine to sit: Min assist, HOB elevated     General bed mobility comments: cues for sequence and use of L LE to self assist    Transfers Overall transfer level: Needs assistance Equipment used: Rolling walker (2 wheels) Transfers: Sit to/from Stand Sit to Stand: Min  assist           General transfer comment: cues for LE management and use of UEs to self assist    Ambulation/Gait Ambulation/Gait assistance: Min assist Gait Distance (Feet): 62 Feet Assistive device: Rolling walker (2 wheels) Gait Pattern/deviations: Step-to pattern, Decreased step length - right, Decreased step length - left, Shuffle, Trunk flexed       General Gait Details: cues for sequence, posture and position from [name]             Wheelchair Mobility    Modified Rankin (Stroke Patients Only)       Balance Overall balance assessment: Needs assistance Sitting-balance support: Feet supported, No upper extremity supported Sitting balance-Leahy Scale: Good     Standing balance support: Bilateral upper extremity supported Standing balance-Leahy Scale: Poor                              Cognition Arousal/Alertness: Awake/alert Behavior During Therapy: WFL for tasks assessed/performed Overall Cognitive Status: Within Functional Limits for tasks assessed                                          Exercises Total Joint Exercises Ankle Circles/Pumps: AROM, Both, 15 reps, Supine Quad Sets: AROM, Both, 10 reps, Supine Heel Slides: AAROM, Right, 15 reps, Supine Hip ABduction/ADduction: AAROM, Right, 15 reps, Supine    General Comments        Pertinent Vitals/Pain Pain Assessment Pain Assessment: 0-10 Pain Score: 6  Pain Location: R hip Pain Descriptors / Indicators: Aching, Grimacing, Sore  Pain Intervention(s): Limited activity within patient's tolerance, Monitored during session, Premedicated before session    Home Living                          Prior Function            PT Goals (current goals can now be found in the care plan section) Acute Rehab PT Goals Patient Stated Goal: REgain IND PT Goal Formulation: With patient Time For Goal Achievement: 02/19/22 Potential to Achieve Goals:  Good Progress towards PT goals: Progressing toward goals    Frequency    7X/week      PT Plan Discharge plan needs to be updated    Co-evaluation              AM-PAC PT "6 Clicks" Mobility   Outcome Measure  Help needed turning from your back to your side while in a flat bed without using bedrails?: A Lot Help needed moving from lying on your back to sitting on the side of a flat bed without using bedrails?: A Little Help needed moving to and from a bed to a chair (including a wheelchair)?: A Little Help needed standing up from a chair using your arms (e.g., wheelchair or bedside chair)?: A Little Help needed to walk in hospital room?: A Little Help needed climbing 3-5 steps with a railing? : A Lot 6 Click Score: 16    End of Session Equipment Utilized During Treatment: Gait belt Activity Tolerance: Patient tolerated treatment well Patient left: in chair;with call bell/phone within reach;with chair alarm set Nurse Communication: Mobility status PT Visit Diagnosis: Difficulty in walking, not elsewhere classified (R26.2)     Time: 5809-9833 PT Time Calculation (min) (ACUTE ONLY): 33 min  Charges:  $Gait Training: 8-22 mins $Therapeutic Exercise: 8-22 mins                     [place] Pager [phone] Office [phone]    Sael Furches 02/13/2022, 12:43 PM

## 2022-02-13 NOTE — Discharge Summary (Signed)
Physician Discharge Summary   Patient: Tabitha Ramos MRN: [id number] DOB: 03-Nov-1930  Admit date:     02/10/2022  Discharge date: 02/13/22  Discharge Physician: Marylu Lund   PCP: Rita Ohara, MD   Recommendations at discharge:    Follow up with PCP in 2-3 weeks Follow up with Orthopedic Surgery in 2 weeks as scheduled  [place] reviewed. No recent controlled substances listed. Limited quantity of narcotic prescribed for post-op pain management following hip surgery  Discharge Diagnoses: Principal Problem:   Femoral neck fracture ([place]) Active Problems:   Protein-calorie malnutrition, severe  Resolved Problems:   * No resolved hospital problems. *  Hospital Course: 88 y.o. female with medical history significant of glaucoma, HLD presents after mechanical fall from home. Pt reports that late previous week prior to admit, pt was cleaning condo after water heater leak. Around this time, pt got up from sofa to walk to front door when she tripped on carpeted floor, causing R hip pain and inability to walk. Symptoms persisted, prompting ED visit   In ED, pt was found to have R femoral neck fx on imaging. Orthopedic Surgery consulted. Hospitalist consulted for consideration for admission.  Assessment and Plan: R femoral neck fracture -s/p mechanical fall -Orthopedic Surgery consulted by EDP -Pt now s/p surgery 10/5 -Therapy recs for SNF. Pt declined SNF and family reported being able to care for pt. Have arranged HHPT -Orthopedic surgery recommended ASA bid x 4 weeks for dvt prophylaxis. Ortho to follow up with pt in 2 weeks   Glaucoma -Resumed home eye drops   Fall -Without syncope -PT following   Hypokalemia -repalced      Consultants: Orthopedic Surgery Procedures performed: R hemi hip arthoplasty 10/5  Disposition: Home Diet recommendation:  Regular diet DISCHARGE MEDICATION: Allergies as of 02/13/2022   No Known Allergies      Medication List     TAKE these  medications    aspirin EC 81 MG tablet Take 1 tablet (81 mg total) by mouth 2 (two) times daily for 28 days. Swallow whole.   b complex vitamins capsule Take 1 capsule by mouth daily.   CALCIUM 500 +D PO Take 1 tablet by mouth daily.   Cholecalciferol 25 MCG (1000 UT) capsule Take by mouth.   dorzolamide-timolol 22.3-6.8 MG/ML ophthalmic solution Commonly known as: COSOPT 1 drop 2 (two) times daily.   multivitamin tablet Take 1 tablet by mouth daily.   oxyCODONE 5 MG immediate release tablet Commonly known as: Oxy IR/ROXICODONE Take 0.5 tablets (2.5 mg total) by mouth every 4 (four) hours as needed for severe pain or moderate pain.   pilocarpine 2 % ophthalmic solution Commonly known as: PILOCAR Place 1 drop into both eyes 4 (four) times daily.   Rocklatan 0.02-0.005 % Soln Generic drug: Netarsudil-Latanoprost Apply 1 drop to eye at bedtime.   vitamin C 1000 MG tablet Take 1,000 mg by mouth daily.   ZINC PO Take 1 tablet by mouth daily.               Durable Medical Equipment  (From admission, onward)           Start     Ordered   02/13/22 1237  For home use only DME Walker rolling  Once       Question Answer Comment  Walker: With 5 Inch Wheels   Patient needs a walker to treat with the following condition Displaced fracture of right femoral neck ([place])      02/13/22  [place]     Willaim Sheng, MD Follow up in 2 week(s).   Specialty: Orthopedic Surgery Contact information: [address] [place] [address] [phone]         Rita Ohara, MD Follow up in 2 week(s).   Specialty: Family Medicine Why: Hospital follow up Contact information: [place] [address] [phone]                Discharge Exam: Danley Danker Weights   02/11/22 0449  Weight: 33.4 kg   General exam: Awake, laying in bed, in nad Respiratory system: Normal respiratory effort, no  wheezing Cardiovascular system: regular rate, s1, s2 Gastrointestinal system: Soft, nondistended, positive BS Central nervous system: CN2-12 grossly intact, strength intact Extremities: Perfused, no clubbing Skin: Normal skin turgor, no notable skin lesions seen Psychiatry: Mood normal // no visual hallucinations   Condition at discharge: fair  The results of significant diagnostics from this hospitalization (including imaging, microbiology, ancillary and laboratory) are listed below for reference.   Imaging Studies: DG HIP UNILAT W OR W/O PELVIS 2-3 VIEWS RIGHT  Result Date: 02/11/2022 CLINICAL DATA:  Status post right hip surgery. EXAM: DG HIP (WITH OR WITHOUT PELVIS) 2-3V RIGHT COMPARISON:  Pelvis and right hip radiographs 02/10/2022 FINDINGS: Interval bipolar right hip hemiarthroplasty. No perihardware lucency is seen to indicate hardware failure or loosening. Expected postoperative changes including intra-articular and lateral right hip soft tissue air. No acute fracture or dislocation. IMPRESSION: Interval bipolar right hip hemiarthroplasty without evidence of hardware failure. Electronically Signed   By: Yvonne Kendall M.D.   On: 02/11/2022 18:38   DG Chest Port 1 View  Result Date: 02/10/2022 CLINICAL DATA:  Preop for hip fracture. EXAM: PORTABLE CHEST 1 VIEW COMPARISON:  Chest x-ray 03/26/2013 FINDINGS: Lungs are hyperinflated, unchanged. There are atherosclerotic calcifications of the aorta. The heart size and mediastinal contours are within normal limits. Both lungs are clear. The visualized skeletal structures are unremarkable. IMPRESSION: 1. No active disease. 2. COPD. Electronically Signed   By: Ronney Asters M.D.   On: 02/10/2022 15:38   DG Hip Unilat W or Wo Pelvis 2-3 Views Right  Result Date: 02/10/2022 CLINICAL DATA:  [place], right hip pain EXAM: DG HIP (WITH OR WITHOUT PELVIS) 2-3V RIGHT COMPARISON:  None Available. FINDINGS: Frontal view of the pelvis as well as frontal and  cross-table lateral views of the right hip are obtained. There is an impacted subcapital right femoral neck fracture with slight ventral angulation at the fracture site. No dislocation. The remainder of the bony pelvis is unremarkable. Left hip is normal. IMPRESSION: 1. Comminuted impacted subcapital right femoral neck fracture. Electronically Signed   By: Randa Ngo M.D.   On: 02/10/2022 15:14    Microbiology: Results for orders placed or performed during the hospital encounter of 02/10/22  Surgical PCR screen     Status: None   Collection Time: 02/11/22 11:16 AM   Specimen: Nasal Mucosa; Nasal Swab  Result Value Ref Range Status   MRSA, PCR NEGATIVE NEGATIVE Final   Staphylococcus aureus NEGATIVE NEGATIVE Final    Comment: (NOTE) The Xpert SA Assay (FDA approved for NASAL specimens in patients 47 years of age and older), is one component of a comprehensive surveillance program. It is not intended to diagnose infection nor to guide or monitor treatment. Performed at [facility], [place] [address] [place], [place] [address]  Labs: CBC: Recent Labs  Lab 02/10/22 1515 02/10/22 1858 02/11/22 0447 02/12/22 0338  WBC 7.6 8.1 6.7 7.6  NEUTROABS 5.6  --   --   --   HGB 14.5 14.0 13.6 11.6*  HCT 44.7 44.4 42.7 36.6  MCV 90.9 94.1 92.6 93.1  PLT 288 272 248 161   Basic Metabolic Panel: Recent Labs  Lab 02/10/22 1515 02/10/22 1858 02/11/22 0447 02/12/22 0338  NA 138  --  143 139  K 3.4*  --  3.5 3.7  CL 103  --  105 105  CO2 27  --  25 26  GLUCOSE 118*  --  92 167*  BUN 22  --  25* 13  CREATININE 0.56 0.52 0.66 <0.30*  CALCIUM 8.9  --  8.8* 8.4*   Liver Function Tests: Recent Labs  Lab 02/11/22 0447 02/12/22 0338  AST 18 17  ALT 17 14  ALKPHOS 68 55  BILITOT 1.2 0.8  PROT 5.8* 5.2*  ALBUMIN 3.1* 2.8*   CBG: No results for input(s): "GLUCAP" in the last 168 hours.  Discharge time spent: less than 30 minutes.  Signed: Marylu Lund,  MD [facility] 02/13/2022

## 2022-02-14 DIAGNOSIS — E43 Unspecified severe protein-calorie malnutrition: Secondary | ICD-10-CM | POA: Diagnosis not present

## 2022-02-14 DIAGNOSIS — S72001A Fracture of unspecified part of neck of right femur, initial encounter for closed fracture: Secondary | ICD-10-CM | POA: Diagnosis not present

## 2022-02-14 DIAGNOSIS — W19XXXA Unspecified fall, initial encounter: Secondary | ICD-10-CM | POA: Diagnosis not present

## 2022-02-14 MED ORDER — BISACODYL 10 MG RE SUPP: 10.0000 mg | RECTAL | Status: DC

## 2022-02-14 MED ORDER — POLYETHYLENE GLYCOL 3350 17 G PO PACK: 17.0000 g | PACK | Freq: Every day | ORAL | Status: DC

## 2022-02-14 NOTE — TOC Progression Note (Signed)
Transition of Care [facility]) - Progression Note    Patient Details  Name: Tabitha Ramos MRN: [id number] Date of Birth: [birth year]  [facility] CM/SW Contact  Henrietta Dine, RN Phone Number: 02/14/2022, 10:23 AM  Clinical Narrative:  d/c orders written on 02/13/22; attempted to contact pt's brother Juanda Crumble at [phone] to discuss, but ended up speaking to her grandson Linton Ham. Garrow, III [phone]); he says that he was not aware the pt had refused SNF; Mr Stemmler says that he and his wife have arranged their schedules to be at home with pt 24 hrs for the next few weeks; he also expressed concern about her PT;  notified him that HHPT has been arranged for pt; Mr Rumore also says he and his wife would like to discuss the plan of care; they will be in the pt's room shortly; will discuss plan of care upon their arrival; will notify MD of events; TOC will con't to follow.         Expected Discharge Plan and Services           Expected Discharge Date: 02/13/22                                     Social Determinants of Health (SDOH) Interventions    Readmission Risk Interventions     No data to display

## 2022-02-14 NOTE — Plan of Care (Signed)
  Problem: Education: Goal: Knowledge of General Education information will improve Description Including pain rating scale, medication(s)/side effects and non-pharmacologic comfort measures Outcome: Progressing   Problem: Health Behavior/Discharge Planning: Goal: Ability to manage health-related needs will improve Outcome: Progressing   

## 2022-02-14 NOTE — TOC Transition Note (Addendum)
Transition of Care [facility]) - CM/SW Discharge Note   Patient Details  Name: DANIRA NYLANDER MRN: [id number] Date of Birth: 20-May-1930  Transition [facility] Care [facility]) CM/SW Contact:  Henrietta Dine, RN Phone Number: 02/14/2022, 11:30 AM   Clinical Narrative:  Spoke with pt and family Renzulli, grandson and his wife) at bedside; explained that d/c orders have been written for the pt and HHPT has been set up with [facility]; pt and her family agree they can care for her at home; d/c address verified [place] [address]; RW already delivered to room; follow up with [facility] added to provider follow up in discharge instructions; will notify [facility] of plans to d/c home today; no TOC needs.   15 - HHOT orders placed; notified Cheryl at [facility]; attempted to notify grandson; LVM on [phone].           Patient Goals and CMS Choice        Discharge Placement                       Discharge Plan and Services                                     Social Determinants of Health (SDOH) Interventions     Readmission Risk Interventions     No data to display

## 2022-02-14 NOTE — Progress Notes (Signed)
The patient is alert and oriented and has been seen by her physician. The orders for discharge were written. IV has been removed. Went over discharge instructions with patient and family. She is being discharged via wheelchair with all of her belongings.  

## 2022-02-14 NOTE — Progress Notes (Signed)
Physical Therapy Treatment Patient Details Name: Tabitha Ramos MRN: [id number] DOB: 1930-11-28 Today's Date: 02/14/2022   History of Present Illness Pt admitted from home s/p fall with R hip fx and now s/p hemi-arthroplasty by posterior approach.    PT Comments    Pt continues very cooperative and progressing steadily with mobility - increased activity tolerance with increased distance ambulated, negotiated stairs, and performed therex program.  Pt's family present for session. Pt eager for dc home this date.   Recommendations for follow up therapy are one component of a multi-disciplinary discharge planning process, led by the attending physician.  Recommendations may be updated based on patient status, additional functional criteria and insurance authorization.  Follow Up Recommendations  Home health PT Can patient physically be transported by private vehicle: Yes   Assistance Recommended at Discharge Frequent or constant Supervision/Assistance  Patient can return home with the following A little help with bathing/dressing/bathroom;Assistance with cooking/housework;Assist for transportation;Help with stairs or ramp for entrance;A little help with walking and/or transfers   Equipment Recommendations  Rolling walker (2 wheels)    Recommendations for Other Services       Precautions / Restrictions Precautions Precautions: Fall Restrictions Weight Bearing Restrictions: No Other Position/Activity Restrictions: WBAT     Mobility  Bed Mobility Overal bed mobility: Needs Assistance Bed Mobility: Supine to Sit     Supine to sit: Min assist     General bed mobility comments: cues for sequence and use of L LE to self assist    Transfers Overall transfer level: Needs assistance Equipment used: Rolling walker (2 wheels) Transfers: Sit to/from Stand Sit to Stand: Min guard           General transfer comment: cues for LE management and use of UEs to self assist     Ambulation/Gait Ambulation/Gait assistance: Min guard Gait Distance (Feet): 84 Feet Assistive device: Rolling walker (2 wheels) Gait Pattern/deviations: Step-to pattern, Decreased step length - right, Decreased step length - left, Shuffle, Trunk flexed       General Gait Details: min cues for sequence, posture, increased ER on R LE and position from RW   Stairs Stairs: Yes Stairs assistance: Min assist, +2 physical assistance, +2 safety/equipment Stair Management: No rails, Step to pattern, Forwards Number of Stairs: 2 General stair comments: HHA bil to ascend, rails to descend, cues for sequence   Wheelchair Mobility    Modified Rankin (Stroke Patients Only)       Balance Overall balance assessment: Needs assistance Sitting-balance support: Feet supported, No upper extremity supported Sitting balance-Leahy Scale: Good     Standing balance support: Single extremity supported Standing balance-Leahy Scale: Poor                              Cognition Arousal/Alertness: Awake/alert Behavior During Therapy: WFL for tasks assessed/performed Overall Cognitive Status: Within Functional Limits for tasks assessed                                          Exercises Total Joint Exercises Ankle Circles/Pumps: AROM, Both, 15 reps, Supine Quad Sets: AROM, Both, 10 reps, Supine Heel Slides: AAROM, Right, 15 reps, Supine Hip ABduction/ADduction: AAROM, Right, 15 reps, Supine    General Comments        Pertinent Vitals/Pain Pain Assessment Pain Assessment: 0-10 Pain Score: 3  Pain  Location: R hip Pain Descriptors / Indicators: Aching, Sore Pain Intervention(s): Monitored during session, Limited activity within patient's tolerance, Premedicated before session    Home Living                          Prior Function            PT Goals (current goals can now be found in the care plan section) Acute Rehab PT Goals Patient  Stated Goal: REgain IND PT Goal Formulation: With patient Time For Goal Achievement: 02/19/22 Potential to Achieve Goals: Good Progress towards PT goals: Progressing toward goals    Frequency    7X/week      PT Plan Discharge plan needs to be updated    Co-evaluation              AM-PAC PT "6 Clicks" Mobility   Outcome Measure  Help needed turning from your back to your side while in a flat bed without using bedrails?: A Little Help needed moving from lying on your back to sitting on the side of a flat bed without using bedrails?: A Little Help needed moving to and from a bed to a chair (including a wheelchair)?: A Little Help needed standing up from a chair using your arms (e.g., wheelchair or bedside chair)?: A Little Help needed to walk in hospital room?: A Little Help needed climbing 3-5 steps with a railing? : A Little 6 Click Score: 18    End of Session Equipment Utilized During Treatment: Gait belt Activity Tolerance: Patient tolerated treatment well Patient left: in chair;with call bell/phone within reach;with family/visitor present Nurse Communication: Mobility status PT Visit Diagnosis: Difficulty in walking, not elsewhere classified (R26.2)     Time: 9735-3299 PT Time Calculation (min) (ACUTE ONLY): 28 min  Charges:  $Gait Training: 8-22 mins $Therapeutic Exercise: 8-22 mins                     Debe Coder PT Acute Rehabilitation Services Pager [phone] Office [phone]    Gera Inboden 02/14/2022, 12:31 PM

## 2022-02-14 NOTE — Progress Notes (Signed)
  Progress Note   Patient: Tabitha Ramos OBS:[id number] DOB: 10/22/1930 DOA: 02/10/2022     4 DOS: the patient was seen and examined on 02/14/2022   Brief hospital course: 88 y.o. female with medical history significant of glaucoma, HLD presents after mechanical fall from home. Pt reports that late previous week prior to admit, pt was cleaning condo after water heater leak. Around this time, pt got up from sofa to walk to front door when she tripped on carpeted floor, causing R hip pain and inability to walk. Symptoms persisted, prompting ED visit   In ED, pt was found to have R femoral neck fx on imaging. Orthopedic Surgery consulted. Hospitalist consulted for consideration for admission.  Assessment and Plan: R femoral neck fracture -s/p mechanical fall -Orthopedic Surgery consulted by EDP -Pt now s/p surgery 10/5 -Therapy recs for SNF. Pt declined SNF and family reported being able to care for pt. Have arranged HHPT -Orthopedic surgery recommended ASA bid x 4 weeks for dvt prophylaxis. Ortho to follow up with pt in 2 weeks   Glaucoma -Resumed home eye drops   Fall -Without syncope -PT following   Hypokalemia -had been replaced     Subjective: Without complaints this AM  Physical Exam: Vitals:   02/13/22 0605 02/13/22 1308 02/13/22 2216 02/14/22 0606  BP: (!) 108/56 (!) 113/54 (!) 105/55 121/67  Pulse: 79 78 68 92  Resp: '18 15 16 18  '$ Temp: 98.8 F (37.1 C) 99.5 F (37.5 C) 98.6 F (37 C) 99.1 F (37.3 C)  TempSrc: Oral Oral Oral Oral  SpO2: 97% 99% 97% 95%  Weight:      Height:       General exam: Awake, laying in bed, in nad Respiratory system: Normal respiratory effort, no wheezing Cardiovascular system: regular rate, s1, s2 Gastrointestinal system: Soft, nondistended, positive BS Central nervous system: CN2-12 grossly intact, strength intact Extremities: Perfused, no clubbing Skin: Normal skin turgor, no notable skin lesions seen Psychiatry: Mood normal // no  visual hallucinations   Data Reviewed:  There are no new results to review at this time.  Family Communication: Pt in room, family not at bedside  Disposition: Status is: Inpatient Remains inpatient appropriate because: severity of illness  Planned Discharge Destination: Home    Author: Marylu Lund, MD 02/14/2022 1:21 PM  For on call review [email or web address].

## 2022-02-15 ENCOUNTER — Telehealth: Payer: Self-pay

## 2022-02-15 NOTE — Telephone Encounter (Signed)
Transition Care Management Follow-up Telephone Call Date of discharge and from where: Tabitha Ramos 02/14/22 How have you been since you were released from the hospital? fair Any questions or concerns? No  Items Reviewed: Did the pt receive and understand the discharge instructions provided? Yes  Medications obtained and verified? Yes  Other? No  Any new allergies since your discharge? No  Dietary orders reviewed? Yes Do you have support at home? Yes   Home Care and Equipment/Supplies: Were home health services ordered? yes If so, what is the name of the agency? [facility]  Has the agency set up a time to come to the patient's home? No not yet Were any new equipment or medical supplies ordered?  No  Follow up appointments reviewed:  PCP Hospital f/u appt confirmed? Yes  Scheduled to see Dr. Tomi Bamberger on 02/24/22 @ 11. [facility] f/u appt confirmed? No  pt.is to f/u with ortho surgery but has not scheduled yet.  Are transportation arrangements needed? No  If their condition worsens, is the pt aware to call PCP or go to the Emergency Dept.? Yes Was the patient provided with contact information for the PCP's office or ED? Yes Was to pt encouraged to call back with questions or concerns? Yes

## 2022-02-16 ENCOUNTER — Encounter: Payer: Self-pay | Admitting: Internal Medicine

## 2022-02-16 DIAGNOSIS — E43 Unspecified severe protein-calorie malnutrition: Secondary | ICD-10-CM | POA: Diagnosis not present

## 2022-02-16 DIAGNOSIS — S72001D Fracture of unspecified part of neck of right femur, subsequent encounter for closed fracture with routine healing: Secondary | ICD-10-CM | POA: Diagnosis not present

## 2022-02-16 DIAGNOSIS — E876 Hypokalemia: Secondary | ICD-10-CM | POA: Diagnosis not present

## 2022-02-16 DIAGNOSIS — E785 Hyperlipidemia, unspecified: Secondary | ICD-10-CM | POA: Diagnosis not present

## 2022-02-16 DIAGNOSIS — H5461 Unqualified visual loss, right eye, normal vision left eye: Secondary | ICD-10-CM | POA: Diagnosis not present

## 2022-02-16 DIAGNOSIS — D649 Anemia, unspecified: Secondary | ICD-10-CM | POA: Diagnosis not present

## 2022-02-16 DIAGNOSIS — R54 Age-related physical debility: Secondary | ICD-10-CM | POA: Diagnosis not present

## 2022-02-16 DIAGNOSIS — Z7982 Long term (current) use of aspirin: Secondary | ICD-10-CM | POA: Diagnosis not present

## 2022-02-16 DIAGNOSIS — R2681 Unsteadiness on feet: Secondary | ICD-10-CM | POA: Diagnosis not present

## 2022-02-16 DIAGNOSIS — Z9181 History of falling: Secondary | ICD-10-CM | POA: Diagnosis not present

## 2022-02-16 DIAGNOSIS — H409 Unspecified glaucoma: Secondary | ICD-10-CM | POA: Diagnosis not present

## 2022-02-16 DIAGNOSIS — Z96641 Presence of right artificial hip joint: Secondary | ICD-10-CM | POA: Diagnosis not present

## 2022-02-16 DIAGNOSIS — E860 Dehydration: Secondary | ICD-10-CM | POA: Diagnosis not present

## 2022-02-16 DIAGNOSIS — R627 Adult failure to thrive: Secondary | ICD-10-CM | POA: Diagnosis not present

## 2022-02-22 NOTE — Progress Notes (Deleted)
No chief complaint on file.  Patient presents for hospital follow-up.  She was admitted 10/4-10/7 for a right femoral neck fracture after a fall at home.  She is s/p right hemi-hip arthorplasty on 10/5 Therapy recommended SNF, patient declined, and family reported being able to care for pt. She got home health PT. She is to f/u with ortho (2wk f/u).  Advised to take ASA BID x 4 weeks for DT prophylaxis.  She denies any abdominal pain, bleeding, bruising.  Osteoporosis:  She recalls being treated with Boniva--can't recall how long she took it, but needed to stop it prior to pulling her teeth.  It was never restarted. She takes a calcium supplement and vitamin D. She gets no regular weight-bearing exercise. DEXA in 03/2012 showed T-2.9 at spine, -3.5 at L fem neck. She had DEXA repeated in 04/2021, which showed significant decline.  T was -5.1 at L forearm radius, T-4.1 at spine, -4.3 at R femur. Due to the severity of her osteoporosis, she was referred to endocrinology for evaluation (to ensure no underlying metabolic cause). The appointment was cancelled, as the patient preferred to discuss with PCP first.  We had discussed Evenity and Prolia.  Evenity was not affordable. Patient declined starting Prolia--per phone message 08/12/21 "stated that she has declined to take Prolia. She states that she wants to concentrate of other areas of her health. She also states that due to her age she is not interested in starting at this time. She states that maybe once other issues are improved she will reconsider."   PMH, PSH, SH reviewed   ROS:   PHYSICAL EXAM:  There were no vitals taken for this visit.  Wt Readings from Last 3 Encounters:  02/11/22 73 lb 10.1 oz (33.4 kg)  07/13/21 85 lb 6.4 oz (38.7 kg)  07/10/20 101 lb 9.6 oz (46.1 kg)      ASSESSMENT/PLAN:   Ensure f/u scheduled with ortho (2 wk f/u rec after surgery, which was 10/5).  Flu, COVID  RSV from pharmacy Shingrix from  pharmacy eventually

## 2022-02-24 ENCOUNTER — Telehealth: Payer: Self-pay | Admitting: *Deleted

## 2022-02-24 ENCOUNTER — Inpatient Hospital Stay: Payer: Medicare HMO | Admitting: Family Medicine

## 2022-02-24 DIAGNOSIS — M8000XD Age-related osteoporosis with current pathological fracture, unspecified site, subsequent encounter for fracture with routine healing: Secondary | ICD-10-CM

## 2022-02-24 DIAGNOSIS — E46 Unspecified protein-calorie malnutrition: Secondary | ICD-10-CM

## 2022-02-24 NOTE — Telephone Encounter (Signed)
No show letter and r/s hospital f/u

## 2022-02-24 NOTE — Telephone Encounter (Signed)

## 2022-02-25 ENCOUNTER — Encounter: Payer: Self-pay | Admitting: Family Medicine

## 2022-02-25 DIAGNOSIS — R8290 Unspecified abnormal findings in urine: Secondary | ICD-10-CM | POA: Diagnosis not present

## 2022-02-25 DIAGNOSIS — Z681 Body mass index (BMI) 19 or less, adult: Secondary | ICD-10-CM | POA: Diagnosis not present

## 2022-02-25 DIAGNOSIS — H409 Unspecified glaucoma: Secondary | ICD-10-CM | POA: Diagnosis not present

## 2022-02-25 DIAGNOSIS — E872 Acidosis, unspecified: Secondary | ICD-10-CM | POA: Diagnosis not present

## 2022-02-25 DIAGNOSIS — Z79899 Other long term (current) drug therapy: Secondary | ICD-10-CM | POA: Diagnosis not present

## 2022-02-25 DIAGNOSIS — D649 Anemia, unspecified: Secondary | ICD-10-CM | POA: Diagnosis not present

## 2022-02-25 DIAGNOSIS — E876 Hypokalemia: Secondary | ICD-10-CM | POA: Diagnosis not present

## 2022-02-25 DIAGNOSIS — H54413A Blindness right eye category 3, normal vision left eye: Secondary | ICD-10-CM | POA: Diagnosis not present

## 2022-02-25 DIAGNOSIS — Z7901 Long term (current) use of anticoagulants: Secondary | ICD-10-CM | POA: Diagnosis not present

## 2022-02-25 DIAGNOSIS — R531 Weakness: Secondary | ICD-10-CM | POA: Diagnosis not present

## 2022-02-25 DIAGNOSIS — Z8781 Personal history of (healed) traumatic fracture: Secondary | ICD-10-CM | POA: Diagnosis not present

## 2022-02-25 DIAGNOSIS — R627 Adult failure to thrive: Secondary | ICD-10-CM | POA: Diagnosis not present

## 2022-02-25 DIAGNOSIS — R54 Age-related physical debility: Secondary | ICD-10-CM | POA: Diagnosis not present

## 2022-02-25 DIAGNOSIS — E860 Dehydration: Secondary | ICD-10-CM | POA: Diagnosis not present

## 2022-02-25 DIAGNOSIS — E43 Unspecified severe protein-calorie malnutrition: Secondary | ICD-10-CM | POA: Diagnosis not present

## 2022-02-25 NOTE — Telephone Encounter (Signed)
No, you don't need to send a no show letter. (But it would have been nice for someone to call us!) I don't know doctors in the [place] area.

## 2022-02-26 DIAGNOSIS — E860 Dehydration: Secondary | ICD-10-CM | POA: Diagnosis not present

## 2022-02-26 DIAGNOSIS — R54 Age-related physical debility: Secondary | ICD-10-CM | POA: Diagnosis not present

## 2022-02-26 DIAGNOSIS — D649 Anemia, unspecified: Secondary | ICD-10-CM | POA: Diagnosis not present

## 2022-02-26 DIAGNOSIS — H409 Unspecified glaucoma: Secondary | ICD-10-CM | POA: Diagnosis not present

## 2022-02-26 DIAGNOSIS — Z79899 Other long term (current) drug therapy: Secondary | ICD-10-CM | POA: Diagnosis not present

## 2022-02-26 DIAGNOSIS — E43 Unspecified severe protein-calorie malnutrition: Secondary | ICD-10-CM | POA: Diagnosis not present

## 2022-02-26 DIAGNOSIS — R8290 Unspecified abnormal findings in urine: Secondary | ICD-10-CM | POA: Diagnosis not present

## 2022-02-26 DIAGNOSIS — Z8781 Personal history of (healed) traumatic fracture: Secondary | ICD-10-CM | POA: Diagnosis not present

## 2022-02-27 DIAGNOSIS — R54 Age-related physical debility: Secondary | ICD-10-CM | POA: Diagnosis not present

## 2022-02-27 DIAGNOSIS — D649 Anemia, unspecified: Secondary | ICD-10-CM | POA: Diagnosis not present

## 2022-02-27 DIAGNOSIS — Z79899 Other long term (current) drug therapy: Secondary | ICD-10-CM | POA: Diagnosis not present

## 2022-02-27 DIAGNOSIS — R8290 Unspecified abnormal findings in urine: Secondary | ICD-10-CM | POA: Diagnosis not present

## 2022-02-27 DIAGNOSIS — E43 Unspecified severe protein-calorie malnutrition: Secondary | ICD-10-CM | POA: Diagnosis not present

## 2022-02-27 DIAGNOSIS — H409 Unspecified glaucoma: Secondary | ICD-10-CM | POA: Diagnosis not present

## 2022-02-27 DIAGNOSIS — Z8781 Personal history of (healed) traumatic fracture: Secondary | ICD-10-CM | POA: Diagnosis not present

## 2022-02-27 DIAGNOSIS — E860 Dehydration: Secondary | ICD-10-CM | POA: Diagnosis not present

## 2022-02-28 DIAGNOSIS — H409 Unspecified glaucoma: Secondary | ICD-10-CM | POA: Diagnosis not present

## 2022-02-28 DIAGNOSIS — Z8781 Personal history of (healed) traumatic fracture: Secondary | ICD-10-CM | POA: Diagnosis not present

## 2022-02-28 DIAGNOSIS — E43 Unspecified severe protein-calorie malnutrition: Secondary | ICD-10-CM | POA: Diagnosis not present

## 2022-02-28 DIAGNOSIS — E860 Dehydration: Secondary | ICD-10-CM | POA: Diagnosis not present

## 2022-02-28 DIAGNOSIS — D649 Anemia, unspecified: Secondary | ICD-10-CM | POA: Diagnosis not present

## 2022-02-28 DIAGNOSIS — R54 Age-related physical debility: Secondary | ICD-10-CM | POA: Diagnosis not present

## 2022-02-28 DIAGNOSIS — R8290 Unspecified abnormal findings in urine: Secondary | ICD-10-CM | POA: Diagnosis not present

## 2022-02-28 DIAGNOSIS — Z79899 Other long term (current) drug therapy: Secondary | ICD-10-CM | POA: Diagnosis not present

## 2022-03-01 DIAGNOSIS — Z79899 Other long term (current) drug therapy: Secondary | ICD-10-CM | POA: Diagnosis not present

## 2022-03-01 DIAGNOSIS — R54 Age-related physical debility: Secondary | ICD-10-CM | POA: Diagnosis not present

## 2022-03-01 DIAGNOSIS — Z8781 Personal history of (healed) traumatic fracture: Secondary | ICD-10-CM | POA: Diagnosis not present

## 2022-03-01 DIAGNOSIS — E43 Unspecified severe protein-calorie malnutrition: Secondary | ICD-10-CM | POA: Diagnosis not present

## 2022-03-01 DIAGNOSIS — E860 Dehydration: Secondary | ICD-10-CM | POA: Diagnosis not present

## 2022-03-01 DIAGNOSIS — D649 Anemia, unspecified: Secondary | ICD-10-CM | POA: Diagnosis not present

## 2022-03-02 DIAGNOSIS — E860 Dehydration: Secondary | ICD-10-CM | POA: Diagnosis not present

## 2022-03-02 DIAGNOSIS — H5440 Blindness, one eye, unspecified eye: Secondary | ICD-10-CM | POA: Diagnosis not present

## 2022-03-02 DIAGNOSIS — R54 Age-related physical debility: Secondary | ICD-10-CM | POA: Diagnosis not present

## 2022-03-02 DIAGNOSIS — Z9181 History of falling: Secondary | ICD-10-CM | POA: Diagnosis not present

## 2022-03-02 DIAGNOSIS — S72001D Fracture of unspecified part of neck of right femur, subsequent encounter for closed fracture with routine healing: Secondary | ICD-10-CM | POA: Diagnosis not present

## 2022-03-02 DIAGNOSIS — R627 Adult failure to thrive: Secondary | ICD-10-CM | POA: Diagnosis not present

## 2022-03-02 DIAGNOSIS — R41841 Cognitive communication deficit: Secondary | ICD-10-CM | POA: Diagnosis not present

## 2022-03-02 DIAGNOSIS — D649 Anemia, unspecified: Secondary | ICD-10-CM | POA: Diagnosis not present

## 2022-03-02 DIAGNOSIS — M6281 Muscle weakness (generalized): Secondary | ICD-10-CM | POA: Diagnosis not present

## 2022-03-02 DIAGNOSIS — H5461 Unqualified visual loss, right eye, normal vision left eye: Secondary | ICD-10-CM | POA: Diagnosis not present

## 2022-03-02 DIAGNOSIS — R531 Weakness: Secondary | ICD-10-CM | POA: Diagnosis not present

## 2022-03-02 DIAGNOSIS — R2689 Other abnormalities of gait and mobility: Secondary | ICD-10-CM | POA: Diagnosis not present

## 2022-03-02 DIAGNOSIS — Z8781 Personal history of (healed) traumatic fracture: Secondary | ICD-10-CM | POA: Diagnosis not present

## 2022-03-02 DIAGNOSIS — E43 Unspecified severe protein-calorie malnutrition: Secondary | ICD-10-CM | POA: Diagnosis not present

## 2022-03-02 DIAGNOSIS — H409 Unspecified glaucoma: Secondary | ICD-10-CM | POA: Diagnosis not present

## 2022-03-03 DIAGNOSIS — R627 Adult failure to thrive: Secondary | ICD-10-CM | POA: Diagnosis not present

## 2022-03-03 DIAGNOSIS — D649 Anemia, unspecified: Secondary | ICD-10-CM | POA: Diagnosis not present

## 2022-03-03 DIAGNOSIS — E860 Dehydration: Secondary | ICD-10-CM | POA: Diagnosis not present

## 2022-03-03 DIAGNOSIS — R531 Weakness: Secondary | ICD-10-CM | POA: Diagnosis not present

## 2022-03-03 DIAGNOSIS — H409 Unspecified glaucoma: Secondary | ICD-10-CM | POA: Diagnosis not present

## 2022-03-11 DIAGNOSIS — S72001D Fracture of unspecified part of neck of right femur, subsequent encounter for closed fracture with routine healing: Secondary | ICD-10-CM | POA: Diagnosis not present

## 2022-03-18 ENCOUNTER — Telehealth: Payer: Self-pay | Admitting: Family Medicine

## 2022-03-18 NOTE — Telephone Encounter (Signed)
Santiago Glad from [facility] health called and stated Corvette will be discharged Saturday from [facility] and they need verbal order for PT and OT for next week. Provided call back number [phone].

## 2022-03-18 NOTE — Telephone Encounter (Signed)
Santiago Glad advised of verbal orders Attempted to call Anay and her Yolanda Bonine to get her scheduled, they did not answer, I did leave a vm.

## 2022-03-18 NOTE — Telephone Encounter (Signed)
[facility] for verbal order. She should schedule a hospital f/u visit. She also needs to schedule her CPE/AWV for 07/2022

## 2022-03-19 ENCOUNTER — Telehealth: Payer: Self-pay

## 2022-03-19 NOTE — Telephone Encounter (Signed)
Pts. Niece Kasandra Knudsen stopped by the office just to give you an update on [name]. She is in the [facility] facility in [place]. She has been there for two weeks and probably will need to be there another 4 weeks. She stated she is down to 73lbs so not looking to good for her right now. That's  why she can not schedule an apt right now.

## 2022-03-19 NOTE — Telephone Encounter (Signed)
It appears that [facility] rehab scheduled her for a f/u visit on 11/20. Seems like patient is not aware of this. Will need to talk with pt or family about this visit--see when she truly gets discharged. May need to be cancelled or r/s.

## 2022-03-19 NOTE — Telephone Encounter (Signed)
Noted. Prior message stated she was going to be discharged on Saturday (that's why [facility] needed orders, and why we asked for visit).

## 2022-03-29 ENCOUNTER — Inpatient Hospital Stay: Payer: Medicare HMO | Admitting: Family Medicine

## 2022-03-31 DIAGNOSIS — S72001D Fracture of unspecified part of neck of right femur, subsequent encounter for closed fracture with routine healing: Secondary | ICD-10-CM | POA: Diagnosis not present

## 2022-04-05 ENCOUNTER — Telehealth: Payer: Self-pay | Admitting: Family Medicine

## 2022-04-05 NOTE — Telephone Encounter (Signed)
Received a call from pt's granddaughter in law. Pts is now living with her and her husband, pt's grandson. She wanted to reschedule pt's hospital follow up but wanted to make it a virtual. I advised that I would need to check with Dr. Tomi Bamberger. She states that pt now lives in [place] and getting her here would be extremely hard at this point. Please advise. Junie Panning can be reached at [phone].

## 2022-04-05 NOTE — Telephone Encounter (Signed)
Unfortunately, I don't. I know that [place] has primary care offices in [place] and in [place] ([facility]). They can search these on the [facility] website.

## 2022-04-05 NOTE — Telephone Encounter (Signed)
She is due to have some labs done (Mg level was low on last check, to ensure that post-op anemia is improving, etc).  We cannot do that virtually. If she is doing to be living in [place], perhaps she should find a new PCP closer to home

## 2022-04-08 DIAGNOSIS — S72001D Fracture of unspecified part of neck of right femur, subsequent encounter for closed fracture with routine healing: Secondary | ICD-10-CM | POA: Diagnosis not present

## 2022-04-25 DIAGNOSIS — I469 Cardiac arrest, cause unspecified: Secondary | ICD-10-CM | POA: Diagnosis not present

## 2022-04-25 DIAGNOSIS — Z743 Need for continuous supervision: Secondary | ICD-10-CM | POA: Diagnosis not present

## 2022-04-28 ENCOUNTER — Telehealth: Payer: Self-pay | Admitting: Family Medicine

## 2022-04-28 NOTE — Telephone Encounter (Signed)
Sympathy card sent 

## 2022-05-10 DEATH — deceased
